# Patient Record
Sex: Female | Born: 1985 | Race: White | Hispanic: No | Marital: Married | Smoking: Never smoker
Health system: Southern US, Community
[De-identification: ages and names within clinical notes are randomized; demographics above are authoritative.]

## PROBLEM LIST (undated history)

## (undated) DIAGNOSIS — H47011 Ischemic optic neuropathy, right eye: Secondary | ICD-10-CM

## (undated) DIAGNOSIS — B009 Herpesviral infection, unspecified: Secondary | ICD-10-CM

## (undated) HISTORY — PX: NO PAST SURGERIES: SHX2092

## (undated) HISTORY — DX: Herpesviral infection, unspecified: B00.9

## (undated) HISTORY — DX: Ischemic optic neuropathy, right eye: H47.011

---

## 2008-08-17 ENCOUNTER — Inpatient Hospital Stay: Payer: Self-pay | Admitting: Obstetrics and Gynecology

## 2008-08-17 ENCOUNTER — Observation Stay: Payer: Self-pay | Admitting: Obstetrics & Gynecology

## 2008-12-28 ENCOUNTER — Ambulatory Visit: Payer: Self-pay | Admitting: Family Medicine

## 2012-07-20 ENCOUNTER — Inpatient Hospital Stay: Payer: Self-pay | Admitting: Obstetrics and Gynecology

## 2012-07-20 LAB — PIH PROFILE
Anion Gap: 10 (ref 7–16)
BUN: 10 mg/dL (ref 7–18)
EGFR (African American): >60
EGFR (Non-African Amer.): >60
Glucose: 72 mg/dL (ref 65–99)
HCT: 34.7 % — ABNORMAL LOW (ref 35.0–47.0)
MCV: 91 fL (ref 80–100)
Platelet: 169 10*3/uL (ref 150–440)
Potassium: 3.7 mmol/L (ref 3.5–5.1)
RBC: 3.81 10*6/uL (ref 3.80–5.20)
SGOT(AST): 29 U/L (ref 15–37)
Sodium: 138 mmol/L (ref 136–145)
WBC: 10.6 10*3/uL (ref 3.6–11.0)

## 2012-07-20 LAB — DIFFERENTIAL
Eosinophil %: 1.2 %
Lymphocyte #: 2.6 10*3/uL (ref 1.0–3.6)
Monocyte #: 0.9 x10 3/mm (ref 0.2–0.9)
Monocyte %: 8.4 %
Neutrophil #: 7 10*3/uL — ABNORMAL HIGH (ref 1.4–6.5)
Neutrophil %: 65.7 %

## 2012-07-20 LAB — PROTEIN / CREATININE RATIO, URINE
Creatinine, Urine: 34.6 mg/dL (ref 30.0–125.0)
Protein, Random Urine: 82 mg/dL — ABNORMAL HIGH (ref 0–12)
Protein/Creat. Ratio: 2370 mg/gCREAT — ABNORMAL HIGH (ref 0–200)

## 2012-07-22 LAB — HEMATOCRIT: HCT: 29.5 % — ABNORMAL LOW (ref 35.0–47.0)

## 2015-04-01 NOTE — H&P (Signed)
L&D Evaluation:  History:   HPI 29 yo G2P1001 @ 1568w6d gestational age by LMP consistent with 19 week ultrasound who presents after being sent from office today with blood pressures in the 140-160 sbp range.  She deneis HA, visual disturbances, and RUQ pain. She has had an uncomplicated pregnancy and received her [facility].  Blood type O+, RI, VZI, RPR NR, HBsAg neg, HV neg, GBS neg (8/15).    Patient's Medical History No Chronic Illness    Patient's Surgical History none    Medications Pre Natal Vitamins    Allergies NKDA    Social History none    Family History Non-Contributory   ROS:   ROS All systems were reviewed.  HEENT, CNS, GI, GU, Respiratory, CV, Renal and Musculoskeletal systems were found to be normal., unless noted in HPI   Exam:   Vital Signs BP >140/90  at admission BP >140 sbp. most in 130's.    Urine Protein 1+, Urine P/C ratio: 2370    General no apparent distress, she is breathing through contractions    Mental Status clear    Chest clear    Heart normal sinus rhythm    Abdomen gravid, tender with contractions    Estimated Fetal Weight Average for gestational age, 6.5-7#    Back no CVAT    Edema trace    Reflexes 2+  brachioradialus    Clonus negative    Pelvic no external lesions, 3cm => 4cm per RN    Mebranes Intact    FHT normal rate with no decels    FHT Description 130/mod var/+accels/no decels    Ucx irregular, q2-5 min    Skin no lesions    Lymph no lymphadenopathy    Other HELLP labs: normal platelets and blood counts, normal LFTs   Impression:   Impression active labor, reactive NST, mild preeclampsia   Plan:   Plan EFM/NST, fluids    Comments - I had a long discussion with the patient and her husband. She has significant proteinuria and has had elevated and mild-range blood pressures today.  She is at 39 weeks and appears to be laboring.  Plan to keep and monitor blood pressures and labor.  I will augment  her if her labor stops. If her blood pressure goes back into the elevated range, will add magnesium. - T&S, IVF   Electronic Signatures: Conard NovakJackson, Eureka Valdes D (MD)  (Signed 29-Aug-13 20:48)  Authored: L&D Evaluation   Last Updated: 29-Aug-13 20:48 by Conard NovakJackson, Guillermo Difrancesco D (MD)

## 2015-12-17 LAB — HM PAP SMEAR: HM Pap smear: NEGATIVE

## 2017-01-24 ENCOUNTER — Ambulatory Visit (INDEPENDENT_AMBULATORY_CARE_PROVIDER_SITE_OTHER): Payer: BC Managed Care – PPO | Admitting: Obstetrics and Gynecology

## 2017-01-24 ENCOUNTER — Encounter: Payer: Self-pay | Admitting: Obstetrics and Gynecology

## 2017-01-24 VITALS — BP 118/70 | Ht 66.0 in | Wt 187.0 lb

## 2017-01-24 DIAGNOSIS — Z01419 Encounter for gynecological examination (general) (routine) without abnormal findings: Secondary | ICD-10-CM

## 2017-01-24 DIAGNOSIS — Z3041 Encounter for surveillance of contraceptive pills: Secondary | ICD-10-CM

## 2017-01-24 MED ORDER — NORGESTIMATE-ETH ESTRADIOL 0.25-35 MG-MCG PO TABS: 1.0000 | ORAL_TABLET | Freq: Every day | ORAL | 11 refills | Status: DC

## 2017-01-24 NOTE — Progress Notes (Signed)
Gynecology Annual Exam  PCP: No PCP Per Patient  Chief Complaint  Patient presents with  . Annual Exam    History of Present Illness: Patient is a 31 y.o. G2P2002 presents for annual exam. The patient has no complaints today. She has no breast, bowel, bladder, or sexual dysfunction complaints.  She continues to menstruate regularly without complaints.    Current contraception is: combined OCP  She is sexually active with her husband with no issues.  Last pap   One year ago, NILM, HPV negative.    The patient is sexually active. The patient wears seatbelts: yes.   last pap: was normal  The patient has regular exercise: yes.  The patient has ever been transfused or tattooed?: yes.  The patient reports that domestic violence in her life is absent.     Review of Systems: Review of Systems  Constitutional: Negative.   HENT: Negative.   Eyes: Negative.   Respiratory: Negative.   Cardiovascular: Negative.   Gastrointestinal: Negative.   Genitourinary: Negative.   Musculoskeletal: Negative.   Skin: Negative.   Neurological: Negative.   Psychiatric/Behavioral: Negative.     Past Medical History:  Diagnosis Date  . Herpes simplex    Past Surgical History:  Procedure Laterality Date  . NO PAST SURGERIES      Medications:   Medication Sig Start Date End Date Taking? Authorizing Provider  norgestimate-ethinyl estradiol (ORTHO-CYCLEN,SPRINTEC,PREVIFEM) 0.25-35 MG-MCG tablet Take 1 tablet by mouth daily.   Yes Historical Provider, MD  valACYclovir (VALTREX) 1000 MG tablet Take 1,000 mg by mouth 2 (two) times daily.   Yes Historical Provider, MD    Allergies: No Known Allergies  Gynecologic History: Patient's last menstrual period was 01/19/2017.  Obstetric History: [id number]  Social History   Social History  . Marital status: Married    Spouse name: N/A  . Number of children: N/A  . Years of education: N/A   Occupational History  . Not on file.   Social History Main  Topics  . Smoking status: Never Smoker  . Smokeless tobacco: Never Used  . Alcohol use Yes  . Drug use: No  . Sexual activity: Yes    Birth control/ protection: Pill   Other Topics Concern  . Not on file   Social History Narrative  . No narrative on file   Family History  Problem Relation Age of Onset  . Bipolar disorder Father   . Hypertension Father   . Colon cancer Paternal Grandfather      Physical Exam BP 118/70   Ht 5\' 6"  (1.676 m)   Wt 187 lb (84.8 kg)   LMP 01/19/2017   BMI 30.18 kg/m   Physical Exam  Constitutional: She is oriented to person, place, and time. She appears well-developed and well-nourished. No distress.  HENT:  Head: Normocephalic and atraumatic.  Eyes: Conjunctivae are normal.  Neck: Normal range of motion. Neck supple. No thyromegaly present.  Cardiovascular: Normal rate, regular rhythm and normal heart sounds.  Exam reveals no gallop and no friction rub.   No murmur heard. Pulmonary/Chest: Effort normal and breath sounds normal. She has no wheezes.  Abdominal: Soft. She exhibits no distension and no mass. There is no tenderness. There is no rebound and no guarding. No hernia. Hernia confirmed negative in the right inguinal area and confirmed negative in the left inguinal area.  Genitourinary: Pelvic exam was performed with patient supine. There is no rash, tenderness or lesion on the right labia. There is no rash,  tenderness or lesion on the left labia. Uterus is not deviated, not enlarged, not fixed and not tender. Cervix exhibits no motion tenderness and no discharge. Right adnexum displays no mass, no tenderness and no fullness. Left adnexum displays no mass and no tenderness. No erythema, tenderness or bleeding in the vagina. No vaginal discharge found.  Musculoskeletal: Normal range of motion.  Lymphadenopathy:       Right: No inguinal adenopathy present.       Left: No inguinal adenopathy present.  Neurological: She is alert and oriented to  person, place, and time.  Skin: Skin is warm and dry. No rash noted.  Psychiatric: She has a normal mood and affect. Her behavior is normal.   Female chaperone present for pelvic and breast  portions of the physical exam  Results:  AUDIT Questionnaire: 5 (low risk behavior) PHQ-9: 0 (negative)  Assessment: 31 y.o. [id number] for routine gyn exam and contraceptive surveillance.   Plan: Problem List Items Addressed This Visit    None    Visit Diagnoses    Women's annual routine gynecological examination    -  Primary   Encounter for surveillance of contraceptive pills       Relevant Medications   norgestimate-ethinyl estradiol (ORTHO-CYCLEN,SPRINTEC,PREVIFEM) 0.25-35 MG-MCG tablet     1) Contraception Education given regarding options for contraception, including oral contraceptives. Will continue current OCP.  2) STI screening was offered and declined  3) Pap - ASCCP guidelines and rational discussed.  Patient opts for routine screening interval. Last was one year ago and was normal.   4) Routine healthcare maintenance including cholesterol, diabetes screening not indicated today.  5) Follow up 1 year for routine annual exam  Thomasene MohairStephen Jaquann Guarisco, MD 01/24/2017 5:29 PM

## 2017-09-05 ENCOUNTER — Other Ambulatory Visit: Payer: Self-pay | Admitting: Ophthalmology

## 2017-09-05 ENCOUNTER — Emergency Department: Payer: BC Managed Care – PPO

## 2017-09-05 ENCOUNTER — Emergency Department
Admission: EM | Admit: 2017-09-05 | Discharge: 2017-09-06 | Disposition: A | Discharge status: Home / Self Care | Payer: BC Managed Care – PPO | Attending: Emergency Medicine | Admitting: Emergency Medicine

## 2017-09-05 ENCOUNTER — Encounter: Payer: Self-pay | Admitting: Emergency Medicine

## 2017-09-05 DIAGNOSIS — H53131 Sudden visual loss, right eye: Secondary | ICD-10-CM | POA: Diagnosis present

## 2017-09-05 DIAGNOSIS — H47011 Ischemic optic neuropathy, right eye: Secondary | ICD-10-CM

## 2017-09-05 DIAGNOSIS — H4710 Unspecified papilledema: Secondary | ICD-10-CM

## 2017-09-05 DIAGNOSIS — Z79899 Other long term (current) drug therapy: Secondary | ICD-10-CM | POA: Diagnosis not present

## 2017-09-05 DIAGNOSIS — H469 Unspecified optic neuritis: Secondary | ICD-10-CM | POA: Diagnosis not present

## 2017-09-05 HISTORY — DX: Ischemic optic neuropathy, right eye: H47.011

## 2017-09-05 MED ORDER — GADOBENATE DIMEGLUMINE 529 MG/ML IV SOLN
15.0000 mL | Freq: Once | INTRAVENOUS | Status: AC | PRN
  Administered 2017-09-05: 15 mL via INTRAVENOUS

## 2017-09-05 NOTE — ED Notes (Signed)
MRI called, states they will be ready for pt in an hour and 15 mins, pt notified of this.

## 2017-09-05 NOTE — Discharge Instructions (Signed)
Dr. Inez Pilgrim will call you in the morning. He has set up an appointment with a neural ophthalmologist for you at [facility] please be sure to keep that. If anything gets worse please return or see Dr. Inez Pilgrim immediately.

## 2017-09-05 NOTE — ED Provider Notes (Signed)
[facility] Emergency Department Provider Note   ____________________________________________   First MD Initiated Contact with Patient 09/05/17 1604     (approximate)  I have reviewed the triage vital signs and the nursing notes.   HISTORY  Chief Complaint vision changes    HPI Victoria Lowe is a 31 y.o. female Comes from [name] with a 2 day history of vision loss in the right eye. Dr. Inez Pilgrim. She has some edema in the optic nerve. Optic neuritis. He wants to get an MRI of her brain and orbits which I have ordered. Patient denies any other problems pain numbness weakness or tingling anywhere.   Past Medical History:  Diagnosis Date  . Herpes simplex     There are no active problems to display for this patient.   Past Surgical History:  Procedure Laterality Date  . NO PAST SURGERIES      Prior to Admission medications   Medication Sig Start Date End Date Taking? Authorizing Provider  norgestimate-ethinyl estradiol (ORTHO-CYCLEN,SPRINTEC,PREVIFEM) 0.25-35 MG-MCG tablet Take 1 tablet by mouth daily. 01/24/17 02/21/17  Conard Novak, MD  norgestimate-ethinyl estradiol (ORTHO-CYCLEN,SPRINTEC,PREVIFEM) 0.25-35 MG-MCG tablet Take 1 tablet by mouth daily. 01/24/17 02/21/17  Conard Novak, MD  valACYclovir (VALTREX) 1000 MG tablet Take 1,000 mg by mouth 2 (two) times daily.    [provider]    Allergies Patient has no known allergies.  Family History  Problem Relation Age of Onset  . Bipolar disorder Father   . Hypertension Father   . Colon cancer Paternal Grandfather     Social History Social History  Substance Use Topics  . Smoking status: Never Smoker  . Smokeless tobacco: Never Used  . Alcohol use Yes    Review of Systems  Constitutional: No fever/chills Eyes: see history of present illness ENT: No sore throat. Cardiovascular: Denies chest pain. Respiratory: Denies shortness of  breath. Gastrointestinal: No abdominal pain.  No nausea, no vomiting.  No diarrhea.  No constipation. Genitourinary: Negative for dysuria. Musculoskeletal: Negative for back pain. Skin: Negative for rash. Neurological: Negative for headaches, focal weakness or numbness.   ____________________________________________   PHYSICAL EXAM:  VITAL SIGNS: ED Triage Vitals  Enc Vitals Group     BP 09/05/17 1608 (!) 149/87     Pulse Rate 09/05/17 1608 (!) 106     Resp --      Temp 09/05/17 1612 98.5 F (36.9 C)     Temp Source 09/05/17 1612 Oral     SpO2 09/05/17 1608 97 %     Weight --      Height --      Head Circumference --      Peak Flow --      Pain Score --      Pain Loc --      Pain Edu? --      Excl. in GC? --     Constitutional: Alert and oriented. Well appearing and in no acute distress. Eyes: Conjunctivae are normal. pupils dilated by ophthalmology EOMI. Head: Atraumatic. Nose: No congestion/rhinnorhea. Mouth/Throat: Mucous membranes are moist.  Oropharynx non-erythematous. Neck: No stridor.   }Cardiovascular: Normal rate, regular rhythm. Grossly normal heart sounds.  Good peripheral circulation. Respiratory: Normal respiratory effort.  No retractions. Lungs CTAB. Gastrointestinal: Soft and nontender.  Musculoskeletal: No lower extremity tenderness nor edema.   Neurologic:  Normal speech and language. No gross focal neurologic deficits are appreciated. specifically cannular 3 through 12 are intact. right eye and  visual fields  were not checked. motor strength is 5 over 5 throughout sensation is intact throughout cerebellar finger to nose and rapid alternating movements and hands are normalNo gait instability. Skin:  Skin is warm, dry and intact. No rash noted. Psychiatric: Mood and affect are normal. Speech and behavior are normal.  ____________________________________________   LABS (all labs ordered are listed, but only abnormal results are displayed)  Labs  Reviewed - No data to display ____________________________________________  EKG   ____________________________________________  RADIOLOGY  MRI shows subtle optic neuritis.there is only one subcentimeter white matter lesion does not look like it is significant per radiology ___________________________________________   PROCEDURES  Procedure(s) performed:  Procedures  Critical Care performed:   ____________________________________________   INITIAL IMPRESSION / ASSESSMENT AND PLAN / ED COURSE  old records were reviewed. Patient has no medical problems. Patient sent here by Dr. Inez Pilgrim I discussed with Dr. Inez Pilgrim.    discussed results again with Dr. Inez Pilgrim. This does not look like MS to radiology Dr. Inez Pilgrim or myself. Dr. Inez Pilgrim is set up a follow-up appointment with neuro-ophthalmologist [facility] he will call the patient in the morning.   ____________________________________________   FINAL CLINICAL IMPRESSION(S) / ED DIAGNOSES  Final diagnoses:  Optic neuritis      NEW MEDICATIONS STARTED DURING THIS VISIT:  New Prescriptions   No medications on file     Note:  This document was prepared using Dragon voice recognition software and may include unintentional dictation errors.    Arnaldo Natal, MD 09/05/17 [phone]

## 2017-09-05 NOTE — ED Notes (Signed)
Patient transported to MRI 

## 2017-09-05 NOTE — ED Triage Notes (Signed)
ARrives from Optho with c/o loss of vision to right lower eye since Saturday.

## 2017-09-06 NOTE — ED Notes (Signed)

## 2017-11-08 ENCOUNTER — Encounter: Payer: Self-pay | Admitting: Physician Assistant

## 2017-11-08 ENCOUNTER — Ambulatory Visit (INDEPENDENT_AMBULATORY_CARE_PROVIDER_SITE_OTHER): Payer: BC Managed Care – PPO | Admitting: Physician Assistant

## 2017-11-08 VITALS — BP 132/82 | HR 92 | Temp 98.8°F | Resp 16 | Ht 66.0 in | Wt 195.0 lb

## 2017-11-08 DIAGNOSIS — Z13220 Encounter for screening for lipoid disorders: Secondary | ICD-10-CM

## 2017-11-08 DIAGNOSIS — Z0000 Encounter for general adult medical examination without abnormal findings: Secondary | ICD-10-CM

## 2017-11-08 DIAGNOSIS — Z131 Encounter for screening for diabetes mellitus: Secondary | ICD-10-CM

## 2017-11-08 DIAGNOSIS — Z23 Encounter for immunization: Secondary | ICD-10-CM | POA: Diagnosis not present

## 2017-11-08 DIAGNOSIS — Z8619 Personal history of other infectious and parasitic diseases: Secondary | ICD-10-CM

## 2017-11-08 DIAGNOSIS — Z1329 Encounter for screening for other suspected endocrine disorder: Secondary | ICD-10-CM

## 2017-11-08 DIAGNOSIS — H47011 Ischemic optic neuropathy, right eye: Secondary | ICD-10-CM

## 2017-11-08 MED ORDER — VALACYCLOVIR HCL 1 G PO TABS: ORAL_TABLET | ORAL | 1 refills | Status: DC

## 2017-11-08 NOTE — Patient Instructions (Signed)

## 2017-11-08 NOTE — Progress Notes (Signed)
Patient: Victoria Lowe Female    DOB: Jul 16, 1986   31 y.o.   MRN: [id number] Visit Date: 11/08/2017  Today's Provider: Trey SailorsAdriana M Pollak, PA-C   Chief Complaint  Patient presents with  . Establish Care   Subjective:    HPI   Victoria Lowe is a 31 y/o woman presenting today to establish care. [place] with her husband of 10 years and her two boys, ages 409 and 395. She works as a Neurosurgeondata manager.  She has a history of menorrhagia for which she is on Sprintec, prescribed by gynecology. Her last PAP was 01/2017 [facility], she cannot remember if there was HPV with it. She denies having abnormal PAPs.  She has a history of non-arteritis ischemic optic neuritis. This was diagnosed after an event in October when she woke up with a spot in her right sided vision that increasingly enlarged over the next few days. She saw her eye doctor who then sent her to the ER where she underwent MRI brain and MRI orbits. MRI brain revealed one small isolated lesion not thought to be of significance and MRI orbits revealed findings consistent with optic neuritis. She is being followed by [facility] for surveillance. Per the patient and the notes in Epic, it was recommended that she be evaluated and managed for any sort of vascular risk. She was also instructed to take ASA 81 mg daily, which she has been doing.  She also has a history of oral cold sores, previously took Valtrex for this. Requesting refill today.      No Known Allergies   Current Outpatient Medications:  .  Norgestimate-Ethinyl Estradiol Triphasic 0.18/0.215/0.25 MG-35 MCG tablet, Take by mouth., Disp: , Rfl:  .  valACYclovir (VALTREX) 1000 MG tablet, Take 1,000 mg by mouth 2 (two) times daily., Disp: , Rfl:  .  norgestimate-ethinyl estradiol (ORTHO-CYCLEN,SPRINTEC,PREVIFEM) 0.25-35 MG-MCG tablet, Take 1 tablet by mouth daily., Disp: 1 Package, Rfl: 11 .  norgestimate-ethinyl estradiol  (ORTHO-CYCLEN,SPRINTEC,PREVIFEM) 0.25-35 MG-MCG tablet, Take 1 tablet by mouth daily., Disp: 1 Package, Rfl: 11  Review of Systems  Constitutional: Negative.   HENT: Negative.   Eyes: Negative.   Respiratory: Negative.   Cardiovascular: Negative.   Gastrointestinal: Negative.   Endocrine: Negative.   Genitourinary: Negative.   Musculoskeletal: Negative.   Skin: Negative.   Allergic/Immunologic: Negative.   Neurological: Negative.   Hematological: Negative.   Psychiatric/Behavioral: Negative.     Social History   Tobacco Use  . Smoking status: Never Smoker  . Smokeless tobacco: Never Used  Substance Use Topics  . Alcohol use: Yes    Alcohol/week: 4.2 oz    Types: 2 Glasses of wine, 5 Cans of beer per week   Objective:   BP 132/82 (BP Location: Right Arm, Patient Position: Sitting, Cuff Size: Normal)   Pulse 92   Temp 98.8 F (37.1 C) (Oral)   Resp 16   Ht 5\' 6"  (1.676 m)   Wt 195 lb (88.5 kg)   LMP 10/18/2017   BMI 31.47 kg/m  Vitals:   11/08/17 0904  BP: 132/82  Pulse: 92  Resp: 16  Temp: 98.8 F (37.1 C)  TempSrc: Oral  Weight: 195 lb (88.5 kg)  Height: 5\' 6"  (1.676 m)     Physical Exam  Constitutional: She is oriented to person, place, and time. She appears well-developed and well-nourished.  HENT:  Right Ear: Tympanic membrane and external ear normal.  Left Ear:  Tympanic membrane and external ear normal.  Mouth/Throat: Oropharynx is clear and moist. No oropharyngeal exudate.  Eyes: Conjunctivae are normal.  Neck: Neck supple. No thyromegaly present.  Cardiovascular: Normal rate and regular rhythm.  Pulmonary/Chest: Effort normal and breath sounds normal.  Abdominal: Soft. Bowel sounds are normal.  Lymphadenopathy:    She has no cervical adenopathy.  Neurological: She is alert and oriented to person, place, and time.  Skin: Skin is warm and dry.  Psychiatric: She has a normal mood and affect. Her behavior is normal.        Assessment & Plan:       1. Encounter for medical examination to establish care   - CBC with Differential  2. Diabetes mellitus screening  - Comprehensive Metabolic Panel (CMET)  3. Thyroid disorder screen  - TSH  4. Screening cholesterol level  - Lipid Profile  5. Non-arteritic AION (anterior ischemic optic neuropathy), right  Will screen for diabetes, high cholesterol. She is normotensive in office today. She is taking ASA 81 mg daily.  6. H/O cold sores  - valACYclovir (VALTREX) 1000 MG tablet; 2,000 mg twice a day for one day at first sign of symptoms.  Dispense: 30 tablet; Refill: 1  7. Flu vaccine need  - Flu Vaccine QUAD 6+ mos PF IM (Fluarix Quad PF)  Return in about 1 year (around 11/08/2018) for cpe.  The entirety of the information documented in the History of Present Illness, Review of Systems and Physical Exam were personally obtained by me. Portions of this information were initially documented by Kavin LeechLaura Walsh, CMA and reviewed by me for thoroughness and accuracy.         Trey SailorsAdriana M Pollak, PA-C  [facility]

## 2017-11-11 ENCOUNTER — Telehealth: Payer: Self-pay | Admitting: Physician Assistant

## 2017-11-11 DIAGNOSIS — Z8619 Personal history of other infectious and parasitic diseases: Secondary | ICD-10-CM

## 2017-11-11 NOTE — Telephone Encounter (Signed)
[facility] [address] faxed refill request for the following medications:  valACYclovir (VALTREX) 1000 MG tablet  90 day supply  Last Rx: 11/08/17 Pt has an active Rx but it is a 30 day supply & the request is a 90 day supply LOV: 11/08/17 Please advise. Thanks TNP

## 2017-11-17 MED ORDER — VALACYCLOVIR HCL 1 G PO TABS: ORAL_TABLET | ORAL | 0 refills | Status: DC

## 2017-11-23 ENCOUNTER — Other Ambulatory Visit: Payer: Self-pay | Admitting: Physician Assistant

## 2017-11-24 LAB — CBC WITH DIFFERENTIAL/PLATELET
Basophils Absolute: 0 10*3/uL (ref 0.0–0.2)
Basos: 0 %
EOS (ABSOLUTE): 0.2 10*3/uL (ref 0.0–0.4)
Eos: 1 %
Hematocrit: 37.4 % (ref 34.0–46.6)
Hemoglobin: 12.3 g/dL (ref 11.1–15.9)
Immature Grans (Abs): 0 10*3/uL (ref 0.0–0.1)
Immature Granulocytes: 0 %
Lymphocytes Absolute: 3.1 10*3/uL (ref 0.7–3.1)
Lymphs: 29 %
MCH: 28.9 pg (ref 26.6–33.0)
MCHC: 32.9 g/dL (ref 31.5–35.7)
MCV: 88 fL (ref 79–97)
Monocytes Absolute: 0.8 10*3/uL (ref 0.1–0.9)
Monocytes: 7 %
Neutrophils Absolute: 6.6 10*3/uL (ref 1.4–7.0)
Neutrophils: 63 %
Platelets: 346 10*3/uL (ref 150–379)
RBC: 4.26 x10E6/uL (ref 3.77–5.28)
RDW: 13.1 % (ref 12.3–15.4)
WBC: 10.6 10*3/uL (ref 3.4–10.8)

## 2017-11-24 LAB — LIPID PANEL
Chol/HDL Ratio: 2.8 ratio (ref 0.0–4.4)
Cholesterol, Total: 185 mg/dL (ref 100–199)
HDL: 67 mg/dL (ref >39)
LDL Calculated: 62 mg/dL (ref 0–99)
Triglycerides: 281 mg/dL — ABNORMAL HIGH (ref 0–149)
VLDL Cholesterol Cal: 56 mg/dL — ABNORMAL HIGH (ref 5–40)

## 2017-11-24 LAB — COMPREHENSIVE METABOLIC PANEL
ALT: 20 IU/L (ref 0–32)
AST: 19 IU/L (ref 0–40)
Albumin/Globulin Ratio: 1.6 (ref 1.2–2.2)
Albumin: 4.2 g/dL (ref 3.5–5.5)
Alkaline Phosphatase: 75 IU/L (ref 39–117)
BUN/Creatinine Ratio: 20 (ref 9–23)
BUN: 13 mg/dL (ref 6–20)
Bilirubin Total: 0.5 mg/dL (ref 0.0–1.2)
CO2: 21 mmol/L (ref 20–29)
Calcium: 8.9 mg/dL (ref 8.7–10.2)
Chloride: 101 mmol/L (ref 96–106)
Creatinine, Ser: 0.65 mg/dL (ref 0.57–1.00)
GFR calc Af Amer: 136 mL/min/{1.73_m2} (ref >59)
GFR calc non Af Amer: 118 mL/min/{1.73_m2} (ref >59)
Globulin, Total: 2.7 g/dL (ref 1.5–4.5)
Glucose: 82 mg/dL (ref 65–99)
Potassium: 4.4 mmol/L (ref 3.5–5.2)
Sodium: 137 mmol/L (ref 134–144)
Total Protein: 6.9 g/dL (ref 6.0–8.5)

## 2017-11-24 LAB — TSH: TSH: 5.17 u[IU]/mL — ABNORMAL HIGH (ref 0.450–4.500)

## 2017-11-25 ENCOUNTER — Encounter: Payer: Self-pay | Admitting: Physician Assistant

## 2017-11-25 ENCOUNTER — Telehealth: Payer: Self-pay | Admitting: Emergency Medicine

## 2017-11-25 DIAGNOSIS — R7989 Other specified abnormal findings of blood chemistry: Secondary | ICD-10-CM

## 2017-11-25 NOTE — Telephone Encounter (Signed)
-----   Message from Trey SailorsAdriana M Pollak, [place]-C sent at 11/24/2017 12:14 PM EST ----- CBC no signs of anemia or infection. CMET with no hyperglycemia, normal kidney and liver function. Total cholesterol normal, LDL very low and in good range. Triglycerides slightly high. TSH is also slightly high, could possibly indicate hypothyroidism. Can we call in and add free T3 and T4 for this patient?

## 2017-11-25 NOTE — Telephone Encounter (Signed)
LMTCB. Labs added on.

## 2017-11-30 LAB — T4F+T3FREE
Free T-3: 3.1 pg/mL
Free Thyroxine: 1.23 ng/dL
Thyroxine (T4): 8.7 ug/dL
Triiodothyronine (T-3), Serum: 174 ng/dL — ABNORMAL HIGH

## 2017-11-30 LAB — SPECIMEN STATUS REPORT

## 2017-12-02 ENCOUNTER — Telehealth: Payer: Self-pay

## 2017-12-02 NOTE — Telephone Encounter (Signed)
Pt advised-Kassidie Hendriks V Dragan Tamburrino, RMA  

## 2017-12-02 NOTE — Telephone Encounter (Signed)
-----   Message from Trey SailorsAdriana M Pollak, [place] sent at 12/02/2017  9:08 AM EST ----- TSH was elevated, but T3 and T4 are essentially normal. This would represent subclinical hypothyroidism, it doesn't require treatment with extra thyroid hormone. But it is something to keep an eye on and recheck in one year or sooner if she experiences extreme fatigue, weight gain, or hair loss.

## 2017-12-02 NOTE — Telephone Encounter (Signed)
lmtcb-Melodye Swor V Mushka Laconte, RMA  

## 2018-01-23 ENCOUNTER — Ambulatory Visit (INDEPENDENT_AMBULATORY_CARE_PROVIDER_SITE_OTHER): Payer: BC Managed Care – PPO | Admitting: Physician Assistant

## 2018-01-23 ENCOUNTER — Encounter: Payer: Self-pay | Admitting: Physician Assistant

## 2018-01-23 VITALS — BP 112/76 | HR 105 | Temp 99.1°F | Resp 16 | Wt 192.0 lb

## 2018-01-23 DIAGNOSIS — R05 Cough: Secondary | ICD-10-CM

## 2018-01-23 DIAGNOSIS — R509 Fever, unspecified: Secondary | ICD-10-CM | POA: Diagnosis not present

## 2018-01-23 DIAGNOSIS — R059 Cough, unspecified: Secondary | ICD-10-CM

## 2018-01-23 DIAGNOSIS — J101 Influenza due to other identified influenza virus with other respiratory manifestations: Secondary | ICD-10-CM

## 2018-01-23 LAB — POCT INFLUENZA A/B
Influenza A, POC: POSITIVE — AB
Influenza B, POC: NEGATIVE

## 2018-01-23 NOTE — Progress Notes (Signed)
[facility]  Chief Complaint  Patient presents with  . URI    Started about two days ago.    Subjective:    Patient ID: Victoria Lowe, female    DOB: 06-Apr-1986, 32 y.o.   MRN: [id number]  Upper Respiratory Infection: Victoria Lowe is a 32 y.o. female complaining of symptoms of a URI, possible sinusitis. Symptoms include bilateral ear pain, congestion, cough, fever and sore throat. Onset of symptoms was 2 days ago, unchanged since that time. She also c/o bilateral ear pressure/pain, achiness, congestion, lightheadedness, low grade fever, nasal congestion, sinus pressure and sore throat for the past 2 days .  She is drinking plenty of fluids. Evaluation to date: none. Treatment to date: cough suppressants and decongestants. The treatment has provided no.   Review of Systems  Constitutional: Positive for fatigue and fever. Negative for chills and diaphoresis.  HENT: Positive for congestion, ear pain, postnasal drip, rhinorrhea, sinus pressure, sinus pain, sneezing and sore throat. Negative for ear discharge, nosebleeds, tinnitus, trouble swallowing and voice change.   Eyes: Positive for discharge. Negative for photophobia, pain, redness, itching and visual disturbance.  Respiratory: Positive for cough, chest tightness, shortness of breath and wheezing. Negative for apnea, choking and stridor.   Gastrointestinal: Negative.   Musculoskeletal: Positive for myalgias. Negative for neck pain and neck stiffness.  Neurological: Positive for light-headedness and headaches. Negative for dizziness.       Objective:   BP 112/76 (BP Location: Right Arm, Patient Position: Sitting, Cuff Size: Normal)   Pulse (!) 105   Temp 99.1 F (37.3 C) (Oral)   Resp 16   Wt 192 lb (87.1 kg)   LMP 01/16/2018   SpO2 97%   BMI 30.99 kg/m   Patient Active Problem List   Diagnosis Date Noted  . 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 norgestimate-ethinyl estradiol (ORTHO-CYCLEN,SPRINTEC,PREVIFEM) 0.25-35 MG-MCG tablet Take 1 tablet by mouth daily.  . valACYclovir (VALTREX) 1000 MG tablet 2,000 mg twice a day for one day at first sign of symptoms.   No facility-administered encounter medications on file as of 01/23/2018.     No Known Allergies     Physical Exam  Constitutional: She is oriented to person, place, and time. She appears well-developed and well-nourished.  HENT:  Right Ear: External ear normal.  Left Ear: External ear normal.  Mouth/Throat: Oropharynx is Lowe and moist. No oropharyngeal exudate.  Eyes: Right eye exhibits discharge. Left eye exhibits discharge.  Neck: Neck supple.  Cardiovascular: Normal rate and regular rhythm.  Pulmonary/Chest: Effort normal and breath sounds normal. No respiratory distress. She has no wheezes. She has no rales.  Lymphadenopathy:    She has cervical adenopathy.  Neurological: She is alert and oriented to person, place, and time.  Skin: Skin is warm and dry.  Psychiatric: She has a normal mood and affect. Her behavior is normal.       Assessment & Plan:  1. Influenza A  Work note provided. Patient declines Tamiflu. May message back if not feeling well enough on Thursday and will extend work note.  2. Fever, unspecified fever cause  - POCT Influenza A/B  3. Cough  - POCT Influenza A/B  Patient Instructions  Influenza, Adult Influenza ("the flu") is an infection in the lungs, nose, and throat (respiratory tract). It is caused by a virus.  The flu causes many common cold symptoms, as well as a high fever and body aches. It can make you feel very sick. The flu spreads easily from person to person (is contagious). Getting a flu shot (influenza vaccination) every year is the best way to prevent the flu. Follow these instructions at home:  Take  over-the-counter and prescription medicines only as told by your doctor.  Use a cool mist humidifier to add moisture (humidity) to the air in your home. This can make it easier to breathe.  Rest as needed.  Drink enough fluid to keep your pee (urine) Lowe or pale yellow.  Cover your mouth and nose when you cough or sneeze.  Wash your hands with soap and water often, especially after you cough or sneeze. If you cannot use soap and water, use hand sanitizer.  Stay home from work or school as told by your doctor. Unless you are visiting your doctor, try to avoid leaving home until your fever has been gone for 24 hours without the use of medicine.  Keep all follow-up visits as told by your doctor. This is important. How is this prevented?  Getting a yearly (annual) flu shot is the best way to avoid getting the flu. You may get the flu shot in late summer, fall, or winter. Ask your doctor when you should get your flu shot.  Wash your hands often or use hand sanitizer often.  Avoid contact with people who are sick during cold and flu season.  Eat healthy foods.  Drink plenty of fluids.  Get enough sleep.  Exercise regularly. Contact a doctor if:  You get new symptoms.  You have: ? Chest pain. ? Watery poop (diarrhea). ? A fever.  Your cough gets worse.  You start to have more mucus.  You feel sick to your stomach (nauseous).  You throw up (vomit). Get help right away if:  You start to be short of breath or have trouble breathing.  Your skin or nails turn a bluish color.  You have very bad pain or stiffness in your neck.  You get a sudden headache.  You get sudden pain in your face or ear.  You cannot stop throwing up. This information is not intended to replace advice given to you by your health care provider. Make sure you discuss any questions you have with your health care provider. Document Released: 08/17/2008 Document Revised: 04/15/2016 Document Reviewed:  09/02/2015 Elsevier Interactive Patient Education  2017 [employer]    Return if symptoms worsen or fail to improve.   The entirety of the information documented in the History of Present Illness, Review of Systems and Physical Exam were personally obtained by me. Portions of this information were initially documented by Kavin LeechLaura Walsh, CMA and reviewed by me for thoroughness and accuracy.

## 2018-01-23 NOTE — Patient Instructions (Signed)

## 2018-02-06 ENCOUNTER — Telehealth: Payer: Self-pay | Admitting: Obstetrics and Gynecology

## 2018-02-06 NOTE — Telephone Encounter (Signed)
-----   Message from Mychart, Generic sent at 02/06/2018 10:11 AM EDT -----    Appointment Request From: Victoria Lowe    With Provider: Thomasene MohairStephen Jackson, MD [[facility]]    Preferred Date Range: Any    Preferred Times: Tuesday Afternoon, Wednesday Afternoon, Thursday Afternoon, Friday Afternoon    Reason for visit: Office Visit    Comments:  After 3:00 would be the best.    Called and left voicemail for patient to call back to be schedule

## 2018-02-08 ENCOUNTER — Ambulatory Visit: Payer: BC Managed Care – PPO | Admitting: Obstetrics and Gynecology

## 2018-02-27 ENCOUNTER — Encounter: Payer: Self-pay | Admitting: Obstetrics and Gynecology

## 2018-02-27 ENCOUNTER — Other Ambulatory Visit: Payer: Self-pay

## 2018-02-27 ENCOUNTER — Ambulatory Visit: Payer: BC Managed Care – PPO | Admitting: Obstetrics and Gynecology

## 2018-02-27 DIAGNOSIS — Z3041 Encounter for surveillance of contraceptive pills: Secondary | ICD-10-CM

## 2018-02-27 MED ORDER — NORGESTIMATE-ETH ESTRADIOL 0.25-35 MG-MCG PO TABS: 1.0000 | ORAL_TABLET | Freq: Every day | ORAL | 1 refills | Status: DC

## 2018-03-01 ENCOUNTER — Telehealth: Payer: Self-pay | Admitting: Physician Assistant

## 2018-03-01 ENCOUNTER — Other Ambulatory Visit: Payer: Self-pay | Admitting: Obstetrics and Gynecology

## 2018-03-01 DIAGNOSIS — Z3041 Encounter for surveillance of contraceptive pills: Secondary | ICD-10-CM

## 2018-03-01 NOTE — Telephone Encounter (Signed)
Faxed [facility] on 11/16/2017

## 2018-03-01 NOTE — Telephone Encounter (Signed)
[facility]

## 2018-03-21 ENCOUNTER — Encounter: Payer: Self-pay | Admitting: Obstetrics and Gynecology

## 2018-03-21 ENCOUNTER — Ambulatory Visit (INDEPENDENT_AMBULATORY_CARE_PROVIDER_SITE_OTHER): Payer: BC Managed Care – PPO | Admitting: Obstetrics and Gynecology

## 2018-03-21 VITALS — BP 122/74 | Ht 65.0 in | Wt 200.0 lb

## 2018-03-21 DIAGNOSIS — Z1339 Encounter for screening examination for other mental health and behavioral disorders: Secondary | ICD-10-CM | POA: Diagnosis not present

## 2018-03-21 DIAGNOSIS — Z3041 Encounter for surveillance of contraceptive pills: Secondary | ICD-10-CM | POA: Diagnosis not present

## 2018-03-21 DIAGNOSIS — Z1331 Encounter for screening for depression: Secondary | ICD-10-CM | POA: Diagnosis not present

## 2018-03-21 DIAGNOSIS — Z01419 Encounter for gynecological examination (general) (routine) without abnormal findings: Secondary | ICD-10-CM | POA: Diagnosis not present

## 2018-03-21 MED ORDER — NORGESTIMATE-ETH ESTRADIOL 0.25-35 MG-MCG PO TABS: 1.0000 | ORAL_TABLET | Freq: Every day | ORAL | 4 refills | Status: DC

## 2018-03-21 NOTE — Progress Notes (Signed)
Gynecology Annual Exam   PCP: Trey Sailors, PA-C  Chief Complaint  Patient presents with  . Annual Exam   History of Present Illness:  [name] is a 32 y.o. [id number] who LMP was Patient's last menstrual period was 03/09/2018., presents today for her annual examination.  Her menses are regular every 28-30 days, lasting 4 day(s).  Dysmenorrhea none. She does not have intermenstrual bleeding.  She is single partner, contraception - OCP (estrogen/progesterone).  Last Pap: 12/17/2015  Results were: no abnormalities /neg HPV DNA negative Hx of STDs: HSV  Last mammogram: n/a There is no FH of breast cancer. There is no FH of ovarian cancer. The patient does not do self-breast exams.  Tobacco use: The patient denies current or previous tobacco use. Alcohol use: social drinker Exercise: not active  The patient wears seatbelts: yes.      Past Medical History:  Diagnosis Date  . Herpes simplex   . 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 NO PAST SURGERIES      Prior to Admission medications   Medication Sig Start Date End Date Taking? Authorizing Provider  SPRINTEC 28 0.25-35 MG-MCG tablet TAKE 1 TABLET BY MOUTH DAILY. 03/01/18   Conard Novak, MD  valACYclovir (VALTREX) 1000 MG tablet 2,000 mg twice a day for one day at first sign of symptoms. 11/17/17   Trey Sailors, PA-C   Allergies: No Known Allergies  Gynecologic History: Patient's last menstrual period was 03/09/2018.  Obstetric History: [id number]  Social History   Socioeconomic History  . Marital status: Married    Spouse name: Not on file  . Number of children: Not on file  . Years of education: Not on file  . Highest education level: Not on file  Occupational History  . Not on file  Social Needs  . Financial resource strain: Not on file  . Food insecurity:    Worry: Not on file    Inability:  Not on file  . Transportation needs:    Medical: Not on file    Non-medical: Not on file  Tobacco Use  . Smoking status: Never Smoker  . Smokeless tobacco: Never Used  Substance and Sexual Activity  . Alcohol use: Yes    Alcohol/week: 4.2 oz    Types: 2 Glasses of wine, 5 Cans of beer per week  . Drug use: No  . Sexual activity: Yes    Birth control/protection: Pill  Lifestyle  . Physical activity:    Days per week: Not on file    Minutes per session: Not on file  . Stress: Not on file  Relationships  . Social connections:    Talks on phone: Not on file    Gets together: Not on file    Attends religious service: Not on file    Active member of club or organization: Not on file    Attends meetings of clubs or organizations: Not on file    Relationship status: Not on file  . Intimate partner violence:    Fear of current or ex partner: Not on file    Emotionally abused: Not on file    Physically abused: Not on file    Forced sexual activity: Not on file  Other Topics Concern  . Not on file  Social History Narrative  . Not on file    Family History  Problem Relation Age of Onset  .  Bipolar disorder Father   . Hypertension Father   . Hyperlipidemia Father   . Lung cancer Paternal Grandfather   . COPD Mother   . Hypertension Mother   . Hypertension Maternal Grandmother   . Hyperlipidemia Maternal Grandmother   . Pancreatitis Paternal Grandmother     Review of Systems  Constitutional: Negative.   HENT: Negative.   Eyes: Negative.        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   Cardiovascular: Negative.   Gastrointestinal: Negative.   Genitourinary: Negative.   Musculoskeletal: Negative.   Skin: Negative.   Neurological: Negative.   Psychiatric/Behavioral: Negative.      Physical Exam BP 122/74   Ht  (1.651 m)   Wt 200 lb (90.7 kg)   LMP 03/09/2018   BMI 33.28 kg/m    Physical Exam  Constitutional: She is oriented to  person, place, and time. She appears well-developed and well-nourished. No distress.  Genitourinary: Uterus normal. Pelvic exam was performed with patient supine. There is no rash, tenderness, lesion or injury on the right labia. There is no rash, tenderness, lesion or injury on the left labia. No erythema, tenderness or bleeding in the vagina. No signs of injury around the vagina. No vaginal discharge found. Right adnexum does not display mass, does not display tenderness and does not display fullness. Left adnexum does not display mass, does not display tenderness and does not display fullness. Cervix does not exhibit motion tenderness, lesion, discharge or polyp.   Uterus is mobile and anteverted. Uterus is not enlarged, tender or exhibiting a mass.  HENT:  Head: Normocephalic and atraumatic.  Eyes: EOM are normal. No scleral icterus.  Neck: Normal range of motion. Neck supple. No thyromegaly present.  Cardiovascular: Normal rate and regular rhythm. Exam reveals no gallop and no friction rub.  No murmur heard. Pulmonary/Chest: Effort normal and breath sounds normal. No respiratory distress. She has no wheezes. She has no rales. Right breast exhibits no inverted nipple, no mass, no nipple discharge, no skin change and no tenderness. Left breast exhibits no inverted nipple, no mass, no nipple discharge, no skin change and no tenderness.  Abdominal: Soft. Bowel sounds are normal. She exhibits no distension and no mass. There is no tenderness. There is no rebound and no guarding.  Musculoskeletal: Normal range of motion. She exhibits no edema or tenderness.  Lymphadenopathy:    She has no cervical adenopathy.       Right: No inguinal adenopathy present.       Left: No inguinal adenopathy present.  Neurological: She is alert and oriented to person, place, and time. No cranial nerve deficit.  Skin: Skin is warm and dry. No rash noted. No erythema.  Psychiatric: She has a normal mood and affect. Her  behavior is normal. Judgment normal.    Female chaperone present for pelvic and breast  portions of the physical exam  Results: AUDIT Questionnaire (screen for alcoholism): 7 PHQ-9: 0  Assessment: 32 y.o. [id number] female here for routine annual gynecologic examination.  Plan: Problem List Items Addressed This Visit    None    Visit Diagnoses    Women's annual routine gynecological examination    -  Primary   Screening for depression       Screening for alcoholism       Encounter for surveillance of contraceptive pills       Relevant Medications   norgestimate-ethinyl estradiol (SPRINTEC 28) 0.25-35 MG-MCG tablet  Screening: -- Blood pressure screen normal -- Colonoscopy - not due -- Mammogram - not due -- Weight screening: obese: discussed management options, including lifestyle, dietary, and exercise. -- Depression screening negative (PHQ-9) -- Nutrition: normal -- cholesterol screening: not due for screening -- osteoporosis screening: not due -- tobacco screening: not using -- alcohol screening: AUDIT questionnaire indicates low-risk usage. -- family history of breast cancer screening: done. not at high risk. -- no evidence of domestic violence or intimate partner violence. -- STD screening: gonorrhea/chlamydia NAAT not collected per patient request. -- pap smear not collected per ASCCP guidelines -- HPV vaccination series: not eligilbe  Thomasene Mohair, MD 03/21/2018 4:26 PM

## 2018-05-09 ENCOUNTER — Encounter: Payer: Self-pay | Admitting: Physician Assistant

## 2018-05-09 ENCOUNTER — Ambulatory Visit: Payer: BC Managed Care – PPO | Admitting: Physician Assistant

## 2018-05-09 VITALS — BP 114/68 | HR 84 | Temp 98.7°F | Resp 16 | Wt 199.0 lb

## 2018-05-09 DIAGNOSIS — J029 Acute pharyngitis, unspecified: Secondary | ICD-10-CM | POA: Diagnosis not present

## 2018-05-09 NOTE — Patient Instructions (Signed)

## 2018-05-09 NOTE — Progress Notes (Signed)
Patient: Victoria Lowe Female    DOB: 06-Jul-1986   32 y.o.   MRN: [id number] Visit Date: 05/10/2018  Today's Provider: Trey SailorsAdriana M Pollak, PA-C   Chief Complaint  Patient presents with  . Sore Throat   Subjective:   Exposed to her children with strep throat two weeks ago.   Sore Throat   This is a new problem. The current episode started in the past 7 days. The problem has been gradually worsening. Neither side of throat is experiencing more pain than the other. There has been no fever. Associated symptoms include congestion, coughing and swollen glands. Pertinent negatives include no abdominal pain, diarrhea, drooling, ear discharge, ear pain, headaches, hoarse voice, plugged ear sensation, neck pain, shortness of breath, stridor, trouble swallowing or vomiting. She has tried NSAIDs for the symptoms. The treatment provided mild relief.       No Known Allergies   Current Outpatient Medications:  .  norgestimate-ethinyl estradiol (SPRINTEC 28) 0.25-35 MG-MCG tablet, Take 1 tablet by mouth daily., Disp: 84 tablet, Rfl: 4 .  valACYclovir (VALTREX) 1000 MG tablet, 2,000 mg twice a day for one day at first sign of symptoms., Disp: 90 tablet, Rfl: 0  Review of Systems  Constitutional: Positive for fatigue. Negative for activity change, appetite change, chills, diaphoresis, fever and unexpected weight change.  HENT: Positive for congestion, postnasal drip, sore throat and voice change. Negative for drooling, ear discharge, ear pain, hoarse voice, rhinorrhea, sinus pressure, sinus pain and trouble swallowing.   Respiratory: Positive for cough. Negative for apnea, choking, chest tightness, shortness of breath, wheezing and stridor.   Gastrointestinal: Negative.  Negative for abdominal pain, diarrhea and vomiting.  Musculoskeletal: Negative.  Negative for neck pain.  Neurological: Negative for dizziness, light-headedness and headaches.    Social History   Tobacco Use  .  Smoking status: Never Smoker  . Smokeless tobacco: Never Used  Substance Use Topics  . Alcohol use: Yes    Alcohol/week: 4.2 oz    Types: 2 Glasses of wine, 5 Cans of beer per week   Objective:   BP 114/68 (BP Location: Right Arm, Patient Position: Sitting, Cuff Size: Normal)   Pulse 84   Temp 98.7 F (37.1 C) (Oral)   Resp 16   Wt 199 lb (90.3 kg)   BMI 33.12 kg/m  Vitals:   05/09/18 1606  BP: 114/68  Pulse: 84  Resp: 16  Temp: 98.7 F (37.1 C)  TempSrc: Oral  Weight: 199 lb (90.3 kg)     Physical Exam  Constitutional: She is oriented to person, place, and time. She appears well-developed and well-nourished.  HENT:  Right Ear: Tympanic membrane and ear canal normal.  Left Ear: Tympanic membrane and ear canal normal.  Mouth/Throat: Uvula is midline and mucous membranes are normal. No uvula swelling. Posterior oropharyngeal erythema present. No oropharyngeal exudate or posterior oropharyngeal edema.  Neck: Neck supple.  Cardiovascular: Normal rate and regular rhythm.  Lymphadenopathy:    She has no cervical adenopathy.  Neurological: She is alert and oriented to person, place, and time.  Psychiatric: She has a normal mood and affect. Her behavior is normal.        Assessment & Plan:     1. Sore throat  Rapid strep negative. Call if not improving by Friday, will send in antibiotic.  - POCT rapid strep A  Return if symptoms worsen or fail to improve.  The entirety of the information documented in the History  of Present Illness, Review of Systems and Physical Exam were personally obtained by me. Portions of this information were initially documented by Ashley Royalty, CMA and reviewed by me for thoroughness and accuracy.          Trinna Post, PA-C  [facility]

## 2018-05-10 LAB — POCT RAPID STREP A (OFFICE): Rapid Strep A Screen: NEGATIVE

## 2018-09-27 ENCOUNTER — Encounter: Payer: Self-pay | Admitting: Physician Assistant

## 2019-04-24 ENCOUNTER — Other Ambulatory Visit: Payer: Self-pay

## 2019-04-24 ENCOUNTER — Ambulatory Visit (INDEPENDENT_AMBULATORY_CARE_PROVIDER_SITE_OTHER): Payer: BC Managed Care – PPO | Admitting: Obstetrics and Gynecology

## 2019-04-24 ENCOUNTER — Encounter: Payer: Self-pay | Admitting: Obstetrics and Gynecology

## 2019-04-24 ENCOUNTER — Other Ambulatory Visit (HOSPITAL_COMMUNITY)
Admission: RE | Admit: 2019-04-24 | Discharge: 2019-04-24 | Disposition: A | Discharge status: Home / Self Care | Payer: BC Managed Care – PPO | Source: Ambulatory Visit | Attending: Obstetrics and Gynecology | Admitting: Obstetrics and Gynecology

## 2019-04-24 VITALS — BP 126/88 | Ht 66.0 in | Wt 212.0 lb

## 2019-04-24 DIAGNOSIS — Z1339 Encounter for screening examination for other mental health and behavioral disorders: Secondary | ICD-10-CM

## 2019-04-24 DIAGNOSIS — Z124 Encounter for screening for malignant neoplasm of cervix: Secondary | ICD-10-CM

## 2019-04-24 DIAGNOSIS — Z01419 Encounter for gynecological examination (general) (routine) without abnormal findings: Secondary | ICD-10-CM | POA: Diagnosis not present

## 2019-04-24 DIAGNOSIS — Z1331 Encounter for screening for depression: Secondary | ICD-10-CM

## 2019-04-24 DIAGNOSIS — Z3041 Encounter for surveillance of contraceptive pills: Secondary | ICD-10-CM

## 2019-04-24 LAB — HM PAP SMEAR: HM Pap smear: NORMAL

## 2019-04-24 MED ORDER — NORGESTIMATE-ETH ESTRADIOL 0.25-35 MG-MCG PO TABS: 1.0000 | ORAL_TABLET | Freq: Every day | ORAL | 4 refills | Status: DC

## 2019-04-24 NOTE — Progress Notes (Signed)
Gynecology Annual Exam  PCP: Trey Sailors, PA-C  Chief Complaint  Patient presents with  . Annual Exam   History of Present Illness:  [name] is a 33 y.o. [id number] who LMP was Patient's last menstrual period was 04/12/2019., presents today for her annual examination.  Her menses are regular every 28-30 days, lasting 4 day(s).  Dysmenorrhea none. She does not have intermenstrual bleeding.  She is sexually active, no issues with intercourse.  Last Pap: 12/17/2015 Results were: no abnormalities /neg HPV DNA negative (per report, unable to verify) Hx of STDs: HSV  Last mammogram: There is no FH of breast cancer. There is no FH of ovarian cancer. The patient does not do self-breast exams.  Tobacco use: The patient denies current or previous tobacco use. Alcohol use: social drinker Exercise: not active  The patient wears seatbelts: yes.      Past Medical History:  Diagnosis Date  . Herpes simplex   . 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 NO PAST SURGERIES     Prior to Admission medications   Medication Sig Start Date End Date Taking? Authorizing Provider  norgestimate-ethinyl estradiol (SPRINTEC 28) 0.25-35 MG-MCG tablet Take 1 tablet by mouth daily. 03/21/18  Yes Conard Novak, MD  valACYclovir (VALTREX) 1000 MG tablet 2,000 mg twice a day for one day at first sign of symptoms. 11/17/17  Yes Trey Sailors, PA-C   Allergies: No Known Allergies  Gynecologic History: Patient's last menstrual period was 04/12/2019.  Obstetric History: G2P2002, s/p SVDx2  Social History   Socioeconomic History  . Marital status: Married    Spouse name: Not on file  . Number of children: Not on file  . Years of education: Not on file  . Highest education level: Not on file  Occupational History  . Not on file  Social Needs  . Financial resource strain: Not on file  . Food  insecurity:    Worry: Not on file    Inability: Not on file  . Transportation needs:    Medical: Not on file    Non-medical: Not on file  Tobacco Use  . Smoking status: Never Smoker  . Smokeless tobacco: Never Used  Substance and Sexual Activity  . Alcohol use: Yes    Alcohol/week: 7.0 standard drinks    Types: 2 Glasses of wine, 5 Cans of beer per week  . Drug use: No  . Sexual activity: Yes    Birth control/protection: Pill  Lifestyle  . Physical activity:    Days per week: Not on file    Minutes per session: Not on file  . Stress: Not on file  Relationships  . Social connections:    Talks on phone: Not on file    Gets together: Not on file    Attends religious service: Not on file    Active member of club or organization: Not on file    Attends meetings of clubs or organizations: Not on file    Relationship status: Not on file  . Intimate partner violence:    Fear of current or ex partner: Not on file    Emotionally abused: Not on file    Physically abused: Not on file    Forced sexual activity: Not on file  Other Topics Concern  . Not on file  Social History Narrative  . Not on file    Family History  Problem Relation  Age of Onset  . Bipolar disorder Father   . Hypertension Father   . Hyperlipidemia Father   . Lung cancer Paternal Grandfather   . COPD Mother   . Hypertension Mother   . Hypertension Maternal Grandmother   . Hyperlipidemia Maternal Grandmother   . Pancreatitis Paternal Grandmother     Review of Systems  Constitutional: Negative.   HENT: Negative.   Eyes: Negative.   Respiratory: Negative.   Cardiovascular: Negative.   Gastrointestinal: Negative.   Genitourinary: Negative.   Musculoskeletal: Negative.   Skin: Negative.   Neurological: Negative.   Psychiatric/Behavioral: Negative.      Physical Exam BP 126/88   Ht 5\' 6"  (1.676 m)   Wt 212 lb (96.2 kg)   LMP 04/12/2019   BMI 34.22 kg/m   Physical Exam Constitutional:       General: She is not in acute distress.    Appearance: Normal appearance. She is well-developed.  Genitourinary:     Pelvic exam was performed with patient supine.     Vulva, urethra, bladder and uterus normal.     No inguinal adenopathy present in the right or left side.    No signs of injury in the vagina.     No vaginal discharge, erythema, tenderness or bleeding.     No cervical motion tenderness, discharge, lesion or polyp.     Uterus is mobile.     Uterus is not enlarged or tender.     No uterine mass detected.    Uterus is anteverted.     No right or left adnexal mass present.     Right adnexa not tender or full.     Left adnexa not tender or full.  HENT:     Head: Normocephalic and atraumatic.  Eyes:     General: No scleral icterus.    Conjunctiva/sclera: Conjunctivae normal.  Neck:     Musculoskeletal: Normal range of motion and neck supple.     Thyroid: No thyromegaly.  Cardiovascular:     Rate and Rhythm: Normal rate and regular rhythm.     Heart sounds: No murmur. No friction rub. No gallop.   Pulmonary:     Effort: Pulmonary effort is normal. No respiratory distress.     Breath sounds: Normal breath sounds. No wheezing or rales.  Chest:     Breasts:        Right: No inverted nipple, mass, nipple discharge, skin change or tenderness.        Left: No inverted nipple, mass, nipple discharge, skin change or tenderness.  Abdominal:     General: Bowel sounds are normal. There is no distension.     Palpations: Abdomen is soft. There is no mass.     Tenderness: There is no abdominal tenderness. There is no guarding or rebound.  Musculoskeletal: Normal range of motion.        General: No swelling or tenderness.  Lymphadenopathy:     Cervical: No cervical adenopathy.     Lower Body: No right inguinal adenopathy. No left inguinal adenopathy.  Neurological:     General: No focal deficit present.     Mental Status: She is alert and oriented to person, place, and time.      Cranial Nerves: No cranial nerve deficit.  Skin:    General: Skin is warm and dry.     Findings: No erythema or rash.  Psychiatric:        Mood and Affect: Mood normal.  Behavior: Behavior normal.        Judgment: Judgment normal.    Female chaperone present for pelvic and breast  portions of the physical exam  Results: AUDIT Questionnaire (screen for alcoholism): 6 PHQ-9: 0   Assessment: 33 y.o. E0P2330 female here for routine annual gynecologic examination.  Plan: Problem List Items Addressed This Visit    None    Visit Diagnoses    Women's annual routine gynecological examination    -  Primary   Relevant Medications   norgestimate-ethinyl estradiol (SPRINTEC 28) 0.25-35 MG-MCG tablet   Other Relevant Orders   Cytology - PAP   Screening for depression       Screening for alcoholism       Pap smear for cervical cancer screening       Relevant Orders   Cytology - PAP   Encounter for surveillance of contraceptive pills       Relevant Medications   norgestimate-ethinyl estradiol (SPRINTEC 28) 0.25-35 MG-MCG tablet      Screening: -- Blood pressure screen normal -- Colonoscopy - not due -- Mammogram - not due -- Weight screening: obese: discussed management options, including lifestyle, dietary, and exercise. -- Depression screening negative (PHQ-9) -- Nutrition: normal -- cholesterol screening: not due for screening -- osteoporosis screening: not due -- tobacco screening: not using -- alcohol screening: AUDIT questionnaire indicates low-risk usage. -- family history of breast cancer screening: done. not at high risk. -- no evidence of domestic violence or intimate partner violence. -- STD screening: gonorrhea/chlamydia NAAT not collected per patient request. -- pap smear collected per ASCCP guidelines -- flu vaccine n/a  Thomasene Mohair, MD 04/24/2019 10:24 AM

## 2019-04-26 LAB — CYTOLOGY - PAP
Diagnosis: NEGATIVE
HPV: NOT DETECTED

## 2020-01-19 ENCOUNTER — Ambulatory Visit: Payer: BC Managed Care – PPO

## 2020-01-19 ENCOUNTER — Ambulatory Visit: Payer: BC Managed Care – PPO | Attending: Internal Medicine

## 2020-01-19 DIAGNOSIS — Z23 Encounter for immunization: Secondary | ICD-10-CM | POA: Insufficient documentation

## 2020-01-19 NOTE — Progress Notes (Signed)
   Covid-19 Vaccination Clinic  Name:  Victoria Lowe    MRN: [id number] DOB: 06-Jan-1986  01/19/2020  Victoria Lowe was observed post Covid-19 immunization for 15 minutes without incidence. She was provided with Vaccine Information Sheet and instruction to access the V-Safe system.   Victoria Lowe was instructed to call 911 with any severe reactions post vaccine: Marland Kitchen Difficulty breathing  . Swelling of your face and throat  . A fast heartbeat  . A bad rash all over your body  . Dizziness and weakness    Immunizations Administered    Name Date Dose VIS Date Route   Moderna COVID-19 Vaccine 01/19/2020  2:23 PM 0.5 mL 10/23/2019 Intramuscular   Manufacturer: Moderna   Lot: [id number]   NDC: 51460-479-98

## 2020-02-16 ENCOUNTER — Ambulatory Visit: Payer: BC Managed Care – PPO | Attending: Internal Medicine

## 2020-02-16 DIAGNOSIS — Z23 Encounter for immunization: Secondary | ICD-10-CM

## 2020-02-16 NOTE — Progress Notes (Signed)
   Covid-19 Vaccination Clinic  Name:  Victoria Lowe    MRN: [id number] DOB: 15-Mar-1986  02/16/2020  [name] was observed post Covid-19 immunization for 15 minutes without incident. She was provided with Vaccine Information Sheet and instruction to access the V-Safe system.   [name] was instructed to call 911 with any severe reactions post vaccine: Marland Kitchen Difficulty breathing  . Swelling of face and throat  . A fast heartbeat  . A bad rash all over body  . Dizziness and weakness   Immunizations Administered    Name Date Dose VIS Date Route   Moderna COVID-19 Vaccine 02/16/2020 11:09 AM 0.5 mL 10/23/2019 Intramuscular   Manufacturer: Gala Murdoch   Lot: [id number]   NDC: 23300-762-26

## 2020-05-09 ENCOUNTER — Encounter: Payer: Self-pay | Admitting: Obstetrics and Gynecology

## 2020-05-09 ENCOUNTER — Other Ambulatory Visit: Payer: Self-pay

## 2020-05-09 ENCOUNTER — Ambulatory Visit (INDEPENDENT_AMBULATORY_CARE_PROVIDER_SITE_OTHER): Payer: BC Managed Care – PPO | Admitting: Obstetrics and Gynecology

## 2020-05-09 VITALS — BP 118/74 | Ht 66.0 in | Wt 184.0 lb

## 2020-05-09 DIAGNOSIS — Z8619 Personal history of other infectious and parasitic diseases: Secondary | ICD-10-CM

## 2020-05-09 DIAGNOSIS — Z01419 Encounter for gynecological examination (general) (routine) without abnormal findings: Secondary | ICD-10-CM

## 2020-05-09 DIAGNOSIS — Z3041 Encounter for surveillance of contraceptive pills: Secondary | ICD-10-CM | POA: Diagnosis not present

## 2020-05-09 DIAGNOSIS — Z1331 Encounter for screening for depression: Secondary | ICD-10-CM

## 2020-05-09 DIAGNOSIS — Z1339 Encounter for screening examination for other mental health and behavioral disorders: Secondary | ICD-10-CM

## 2020-05-09 MED ORDER — NORGESTIMATE-ETH ESTRADIOL 0.25-35 MG-MCG PO TABS: 1.0000 | ORAL_TABLET | Freq: Every day | ORAL | 4 refills | Status: DC

## 2020-05-09 MED ORDER — VALACYCLOVIR HCL 1 G PO TABS: ORAL_TABLET | ORAL | 0 refills | Status: DC

## 2020-05-09 NOTE — Progress Notes (Signed)
Gynecology Annual Exam  PCP: Trey Sailors, PA-C  Chief Complaint  Patient presents with  . Annual Exam   History of Present Illness:  [name] is a 34 y.o. [phone] who LMP was No LMP recorded., presents today for her annual examination.  Her menses are regular every 28-30 days, lasting 4 day(s).  Dysmenorrhea none. She does not have intermenstrual bleeding.  She is sexually active.  She has no issues with intercourse.  Last Pap: 04/2019 Results were: no abnormalities /neg HPV DNA negative Hx of STDs: none  There is no FH of breast cancer. There is no FH of ovarian cancer. The patient does do self-breast exams.  Tobacco use: The patient denies current or previous tobacco use. Alcohol use: social drinker Exercise: 3-4 x per week  The patient wears seatbelts: yes.   The patient reports that domestic violence in her life is absent.   Past Medical History:  Diagnosis Date  . Herpes simplex   . 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 NO PAST SURGERIES      Prior to Admission medications   Medication Sig Start Date End Date Taking? Authorizing Provider  norgestimate-ethinyl estradiol (SPRINTEC 28) 0.25-35 MG-MCG tablet Take 1 tablet by mouth daily. 04/24/19  Yes Conard Novak, MD  valACYclovir (VALTREX) 1000 MG tablet 2,000 mg twice a day for one day at first sign of symptoms. 11/17/17  Yes Trey Sailors, PA-C   Allergies: No Known Allergies  Obstetric History: [id number]  Social History   Socioeconomic History  . Marital status: Married    Spouse name: Not on file  . Number of children: Not on file  . Years of education: Not on file  . Highest education level: Not on file  Occupational History  . Not on file  Tobacco Use  . Smoking status: Never Smoker  . Smokeless tobacco: Never Used  Vaping Use  . Vaping Use: Never used  Substance and Sexual Activity   . Alcohol use: Yes    Alcohol/week: 7.0 standard drinks    Types: 2 Glasses of wine, 5 Cans of beer per week  . Drug use: No  . Sexual activity: Yes    Birth control/protection: Pill  Other Topics Concern  . Not on file  Social History Narrative  . Not on file   Social Determinants of Health   Financial Resource Strain:   . Difficulty of Paying Living Expenses:   Food Insecurity:   . Worried About Programme researcher, broadcasting/film/video in the Last Year:   . Barista in the Last Year:   Transportation Needs:   . Freight forwarder (Medical):   Marland Kitchen Lack of Transportation (Non-Medical):   Physical Activity:   . Days of Exercise per Week:   . Minutes of Exercise per Session:   Stress:   . Feeling of Stress :   Social Connections:   . Frequency of Communication with Friends and Family:   . Frequency of Social Gatherings with Friends and Family:   . Attends Religious Services:   . Active Member of Clubs or Organizations:   . Attends Banker Meetings:   Marland Kitchen Marital Status:   Intimate Partner Violence:   . Fear of Current or Ex-Partner:   . Emotionally Abused:   Marland Kitchen Physically Abused:   . Sexually Abused:     Family History  Problem Relation Age of  Onset  . Bipolar disorder Father   . Hypertension Father   . Hyperlipidemia Father   . Lung cancer Paternal Grandfather   . COPD Mother   . Hypertension Mother   . Hypertension Maternal Grandmother   . Hyperlipidemia Maternal Grandmother   . Pancreatitis Paternal Grandmother     Review of Systems  Constitutional: Negative.   HENT: Negative.   Eyes: Negative.   Respiratory: Negative.   Cardiovascular: Negative.   Gastrointestinal: Negative.   Genitourinary: Negative.   Musculoskeletal: Negative.   Skin: Negative.   Neurological: Negative.   Psychiatric/Behavioral: Negative.      Physical Exam BP 118/74   Ht 5\' 6"  (1.676 m)   Wt 184 lb (83.5 kg)   BMI 29.70 kg/m   Physical Exam Constitutional:      General:  She is not in acute distress.    Appearance: Normal appearance. She is well-developed.  Genitourinary:     Pelvic exam was performed with patient in the lithotomy position.     Vulva, urethra, bladder and uterus normal.     No inguinal adenopathy present in the right or left side.    No signs of injury in the vagina.     No vaginal discharge, erythema, tenderness or bleeding.     No cervical motion tenderness, discharge, lesion or polyp.     Uterus is mobile.     Uterus is not enlarged or tender.     No uterine mass detected.    Uterus is anteverted.     No right or left adnexal mass present.     Right adnexa not tender or full.     Left adnexa not tender or full.  HENT:     Head: Normocephalic and atraumatic.  Eyes:     General: No scleral icterus.    Conjunctiva/sclera: Conjunctivae normal.  Neck:     Thyroid: No thyromegaly.  Cardiovascular:     Rate and Rhythm: Normal rate and regular rhythm.     Heart sounds: No murmur heard.  No friction rub. No gallop.   Pulmonary:     Effort: Pulmonary effort is normal. No respiratory distress.     Breath sounds: Normal breath sounds. No wheezing or rales.  Chest:     Breasts:        Right: No inverted nipple, mass, nipple discharge, skin change or tenderness.        Left: No inverted nipple, mass, nipple discharge, skin change or tenderness.  Abdominal:     General: Bowel sounds are normal. There is no distension.     Palpations: Abdomen is soft. There is no mass.     Tenderness: There is no abdominal tenderness. There is no guarding or rebound.  Musculoskeletal:        General: No swelling or tenderness. Normal range of motion.     Cervical back: Normal range of motion and neck supple.  Lymphadenopathy:     Cervical: No cervical adenopathy.     Lower Body: No right inguinal adenopathy. No left inguinal adenopathy.  Neurological:     General: No focal deficit present.     Mental Status: She is alert and oriented to person,  place, and time.     Cranial Nerves: No cranial nerve deficit.  Skin:    General: Skin is warm and dry.     Findings: No erythema or rash.  Psychiatric:        Mood and Affect: Mood normal.  Behavior: Behavior normal.        Judgment: Judgment normal.     Female chaperone present for pelvic and breast  portions of the physical exam  Results: AUDIT Questionnaire (screen for alcoholism): 6 PHQ-9: 1   Assessment: 34 y.o. G83P2002 female here for routine annual gynecologic examination  Plan: Problem List Items Addressed This Visit      Other   H/O cold sores   Relevant Medications   valACYclovir (VALTREX) 1000 MG tablet    Other Visit Diagnoses    Women's annual routine gynecological examination    -  Primary   Relevant Medications   norgestimate-ethinyl estradiol (SPRINTEC 28) 0.25-35 MG-MCG tablet   Screening for depression       Screening for alcoholism       Encounter for surveillance of contraceptive pills       Relevant Medications   norgestimate-ethinyl estradiol (SPRINTEC 28) 0.25-35 MG-MCG tablet      Screening: -- Blood pressure screen normal -- Weight screening: overweight: continue to monitor -- Depression screening negative (PHQ-9) -- Nutrition: normal -- cholesterol screening: not due for screening -- osteoporosis screening: not due -- tobacco screening: not using -- alcohol screening: AUDIT questionnaire indicates low-risk usage. -- family history of breast cancer screening: done. not at high risk. -- no evidence of domestic violence or intimate partner violence. -- STD screening: gonorrhea/chlamydia NAAT not collected per patient request. -- pap smear not collected per ASCCP guidelines  Refill birth control pills.  Thomasene Mohair, MD 05/09/2020 4:27 PM

## 2020-09-17 ENCOUNTER — Emergency Department: Payer: BC Managed Care – PPO

## 2020-09-17 ENCOUNTER — Emergency Department
Admission: EM | Admit: 2020-09-17 | Discharge: 2020-09-17 | Disposition: A | Discharge status: Home / Self Care | Payer: BC Managed Care – PPO | Attending: Emergency Medicine | Admitting: Emergency Medicine

## 2020-09-17 ENCOUNTER — Ambulatory Visit: Payer: Self-pay

## 2020-09-17 ENCOUNTER — Encounter: Payer: Self-pay | Admitting: Emergency Medicine

## 2020-09-17 ENCOUNTER — Other Ambulatory Visit: Payer: Self-pay

## 2020-09-17 DIAGNOSIS — R079 Chest pain, unspecified: Secondary | ICD-10-CM

## 2020-09-17 DIAGNOSIS — Z20822 Contact with and (suspected) exposure to covid-19: Secondary | ICD-10-CM | POA: Insufficient documentation

## 2020-09-17 DIAGNOSIS — R0789 Other chest pain: Secondary | ICD-10-CM | POA: Insufficient documentation

## 2020-09-17 LAB — BASIC METABOLIC PANEL
Anion gap: 9 (ref 5–15)
BUN: 10 mg/dL (ref 6–20)
CO2: 25 mmol/L (ref 22–32)
Calcium: 9.1 mg/dL (ref 8.9–10.3)
Chloride: 103 mmol/L (ref 98–111)
Creatinine, Ser: 0.54 mg/dL (ref 0.44–1.00)
GFR, Estimated: >60 mL/min (ref >60)
Glucose, Bld: 109 mg/dL — ABNORMAL HIGH (ref 70–99)
Potassium: 3.4 mmol/L — ABNORMAL LOW (ref 3.5–5.1)
Sodium: 137 mmol/L (ref 135–145)

## 2020-09-17 LAB — CBC
HCT: 36 % (ref 36.0–46.0)
Hemoglobin: 12.1 g/dL (ref 12.0–15.0)
MCH: 30.1 pg (ref 26.0–34.0)
MCHC: 33.6 g/dL (ref 30.0–36.0)
MCV: 89.6 fL (ref 80.0–100.0)
Platelets: 311 10*3/uL (ref 150–400)
RBC: 4.02 MIL/uL (ref 3.87–5.11)
RDW: 12.1 % (ref 11.5–15.5)
WBC: 8.8 10*3/uL (ref 4.0–10.5)
nRBC: 0 % (ref 0.0–0.2)

## 2020-09-17 LAB — TROPONIN I (HIGH SENSITIVITY)
Troponin I (High Sensitivity): 2 ng/L (ref <18)
Troponin I (High Sensitivity): 2 ng/L (ref <18)

## 2020-09-17 LAB — RESPIRATORY PANEL BY RT PCR (FLU A&B, COVID)
Influenza A by PCR: NEGATIVE
Influenza B by PCR: NEGATIVE
SARS Coronavirus 2 by RT PCR: NEGATIVE

## 2020-09-17 LAB — POC URINE PREG, ED: Preg Test, Ur: NEGATIVE

## 2020-09-17 LAB — FIBRIN DERIVATIVES D-DIMER (ARMC ONLY): Fibrin derivatives D-dimer (ARMC): 446.08 ng/mL (FEU) (ref 0.00–499.00)

## 2020-09-17 MED ORDER — ACETAMINOPHEN 500 MG PO TABS: 1000.0000 mg | ORAL_TABLET | Freq: Once | ORAL | Status: DC

## 2020-09-17 MED ORDER — NAPROXEN 500 MG PO TABS: 500.0000 mg | ORAL_TABLET | Freq: Once | ORAL | Status: DC

## 2020-09-17 NOTE — ED Provider Notes (Signed)
[facility] Emergency Department Provider Note  ____________________________________________   First MD Initiated Contact with Patient 09/17/20 1923     (approximate)  I have reviewed the triage vital signs and the nursing notes.   HISTORY  Chief Complaint Chest Pain   HPI Victoria Lowe is a 34 y.o. female without significant past medical history currently on OCPs birth control who presents for assessment of subsubsternal chest pressure and tightness that began yesterday and while initially seem to improve overnight got worse again today.  No prior similar episodes.  No clear alleviating or aggravating factors.  Patient denies any other acute symptoms including cough, shortness of breath, fevers, chills, headache, earache, sore throat, nausea, vomiting, diarrhea, dysuria, abdominal pain, back pain, rash, or extremity weakness numbness or tingling.  Patient denies EtOH use, illicit drug use, or tobacco abuse.  No history of CAD, PE/DVT or other significant cardiopulmonary history.         Past Medical History:  Diagnosis Date  . Herpes simplex   . 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 NO PAST SURGERIES      Prior to Admission medications   Medication Sig Start Date End Date Taking? Authorizing Provider  norgestimate-ethinyl estradiol (SPRINTEC 28) 0.25-35 MG-MCG tablet Take 1 tablet by mouth daily. 05/09/20   Conard Novak, MD  valACYclovir (VALTREX) 1000 MG tablet 2,000 mg twice a day for one day at first sign of symptoms. 05/09/20   Conard Novak, MD    Allergies Patient has no known allergies.  Family History  Problem Relation Age of Onset  . Bipolar disorder Father   .  Hypertension Father   . Hyperlipidemia Father   . Lung cancer Paternal Grandfather   . COPD Mother   . Hypertension Mother   . Hypertension Maternal Grandmother   . Hyperlipidemia Maternal Grandmother   . Pancreatitis Paternal Grandmother     Social History Social History   Tobacco Use  . Smoking status: Never Smoker  . Smokeless tobacco: Never Used  Vaping Use  . Vaping Use: Never used  Substance Use Topics  . Alcohol use: Yes    Alcohol/week: 7.0 standard drinks    Types: 2 Glasses of wine, 5 Cans of beer per week  . Drug use: No    Review of Systems  Review of Systems  Constitutional: Negative for chills and fever.  HENT: Negative for sore throat.   Eyes: Negative for pain.  Respiratory: Negative for cough and stridor.   Cardiovascular: Positive for chest pain.  Gastrointestinal: Negative for vomiting.  Genitourinary: Negative for dysuria.  Musculoskeletal: Negative for myalgias.  Skin: Negative for rash.  Neurological: Negative for seizures, loss of consciousness and headaches.  Psychiatric/Behavioral: Negative for suicidal ideas.  All other systems reviewed and are negative.     ____________________________________________   PHYSICAL EXAM:  VITAL SIGNS: ED Triage Vitals  Enc Vitals Group     BP 09/17/20 1528 (!) 165/88     Pulse Rate 09/17/20 1528 98     Resp 09/17/20 1528 17     Temp 09/17/20 1528 100 F (37.8 C)     Temp Source 09/17/20 1528 Oral     SpO2 09/17/20 1528 100 %     Weight  09/17/20 1528 178 lb (80.7 kg)     Height 09/17/20 1528 5\' 6"  (1.676 m)     Head Circumference --      Peak Flow --      Pain Score 09/17/20 1532 4     Pain Loc --      Pain Edu? --      Excl. in GC? --    Vitals:   09/17/20 1528  BP: (!) 165/88  Pulse: 98  Resp: 17  Temp: 100 F (37.8 C)  SpO2: 100%   Physical Exam Vitals and nursing note reviewed.  Constitutional:      General: She is not in acute distress.    Appearance: She is well-developed.    HENT:     Head: Normocephalic and atraumatic.     Right Ear: External ear normal.     Left Ear: External ear normal.     Nose: Nose normal.     Mouth/Throat:     Mouth: Mucous membranes are moist.  Eyes:     Conjunctiva/sclera: Conjunctivae normal.  Cardiovascular:     Rate and Rhythm: Normal rate and regular rhythm.     Heart sounds: No murmur heard.   Pulmonary:     Effort: Pulmonary effort is normal. No respiratory distress.     Breath sounds: Normal breath sounds.  Abdominal:     Palpations: Abdomen is soft.     Tenderness: There is no abdominal tenderness.  Musculoskeletal:     Cervical back: Neck supple. No rigidity.  Skin:    General: Skin is warm and dry.     Capillary Refill: Capillary refill takes less than 2 seconds.  Neurological:     Mental Status: She is alert and oriented to person, place, and time.  Psychiatric:        Mood and Affect: Mood normal.      ____________________________________________   LABS (all labs ordered are listed, but only abnormal results are displayed)  Labs Reviewed  BASIC METABOLIC PANEL - Abnormal; Notable for the following components:      Result Value   Potassium 3.4 (*)    Glucose, Bld 109 (*)    All other components within normal limits  RESPIRATORY PANEL BY RT PCR (FLU A&B, COVID)  CBC  FIBRIN DERIVATIVES D-DIMER ([facility] ONLY)  POC URINE PREG, ED  TROPONIN I (HIGH SENSITIVITY)  TROPONIN I (HIGH SENSITIVITY)   ____________________________________________  EKG  Sinus rhythm with a ventricular rate of 94, normal axis, unremarkable intervals, sinus arrhythmia in lead III with no other evidence of acute ischemia no significant underlying arrhythmia. ____________________________________________  RADIOLOGY  ED MD interpretation: No focal consolidations, pneumothorax, edema, effusions, or other acute thoracic process.  Mediastinum is not wide.  Official radiology report(s): DG Chest 2 View  Result Date:  09/17/2020 CLINICAL DATA:  Acute chest pain EXAM: CHEST - 2 VIEW COMPARISON:  None. FINDINGS: The heart size and mediastinal contours are within normal limits. Both lungs are clear. The visualized skeletal structures are unremarkable. IMPRESSION: No active cardiopulmonary disease. Electronically Signed   By: 09/19/2020.  Shick M.D.   On: 09/17/2020 16:26    ____________________________________________   PROCEDURES  Procedure(s) performed (including Critical Care):  Procedures   ____________________________________________   INITIAL IMPRESSION / ASSESSMENT AND PLAN / ED COURSE        Patient presents with above-stated history exam for assessment of some substernal chest pain.  Patient is hypertensive with a BP of 1 6548 with stable vital signs on  room air.  Exam as above.  Primary differential includes but is not limited to pneumonia, ACS, arrhythmia, acute anemia, metabolic arrangements, PE, myocarditis, pericarditis, and pleurisy.  Overall symptoms described are not as consistent with GI pathology I have very low suspicion for dissection at this time given symmetric pulses and description of pain as well as normal mediastinum on chest x-ray.  No historical or exam findings to suggest an acute traumatic injury or chest wall cellulitis.  Low suspicion for ACS given reassuring EKG and initial troponin is nonelevated.  Overall plan to obtain a second troponin..  Chest x-ray shows no evidence of pneumonia, pneumothorax, or other acute intrathoracic process.  No evidence of arrhythmia on EKG. we will send D-dimer to assess for patient's risk for PE.  Covid PCR was also sent given patient's temperature is slightly elevated at 37.8.  Patient seen by Dr. Larinda Buttery at approximately 2000.  Plan is to follow-up repeat troponin and D-dimer.  If D-dimer is elevated plan is to obtain CTA chest.  If D-dimer and troponin are both within normal limits patient may be discharged with plan for close outpatient PCP  follow-up.  ____________________________________________   FINAL CLINICAL IMPRESSION(S) / ED DIAGNOSES  Final diagnoses:  Chest pain, unspecified type    Medications  acetaminophen (TYLENOL) tablet 1,000 mg (has no administration in time range)  naproxen (NAPROSYN) tablet 500 mg (has no administration in time range)     ED Discharge Orders    None       Note:  This document was prepared using Dragon voice recognition software and may include unintentional dictation errors.   Gilles Chiquito, MD 09/17/20 2015

## 2020-09-17 NOTE — Discharge Instructions (Signed)
Your blood pressure was noted to be elevated today emergency room.  Please have this rechecked by your primary care doctor in 5 to 7 days.

## 2020-09-17 NOTE — ED Notes (Signed)
Called pt with no response. Checked all waiting rooms

## 2020-09-17 NOTE — ED Notes (Signed)
Pt reports at approx 1900 last night she began to have left sided "poking" chest pain, with numb/achy radiation to the left arm. Reports some mild dizziness intially, which cleared up. Pt denies n/v/d/abd pain

## 2020-09-17 NOTE — Telephone Encounter (Signed)
  Patient called stating that she started to have chest tightness and stabbing pain last evening.  She states that it has gotten worse today.  She states the tightness goes across her chest but the pain at 5 is only on the left side. It travels into her left shoulder and her left arm is aching. She has no Hx of heart issues. Denies injury. No nausea sweats cough Per protocol patient will go to ER for evaluation.  She states she has friend to drive and refused 762. Care advice read to patient. She verbalized understanding. Reason for Disposition . Pain also in shoulder(s) or arm(s) or jaw (Exception: pain is clearly made worse by movement)  Answer Assessment - Initial Assessment Questions 1. LOCATION: "Where does it hurt?"       Tight across chest left side stabbing 2. RADIATION: "Does the pain go anywhere else?" (e.g., into neck, jaw, arms, back)    Shoulder shoulder and left arm ache 3. ONSET: "When did the chest pain begin?" (Minutes, hours or days)      Last night 4. PATTERN "Does the pain come and go, or has it been constant since it started?"  "Does it get worse with exertion?"     Last hout worse 5. DURATION: "How long does it last" (e.g., seconds, minutes, hours)     constant 6. SEVERITY: "How bad is the pain?"  (e.g., Scale 1-10; mild, moderate, or severe)    - MILD (1-3): doesn't interfere with normal activities     - MODERATE (4-7): interferes with normal activities or awakens from sleep    - SEVERE (8-10): excruciating pain, unable to do any normal activities       5 7. CARDIAC RISK FACTORS: "Do you have any history of heart problems or risk factors for heart disease?" (e.g., angina, prior heart attack; diabetes, high blood pressure, high cholesterol, smoker, or strong family history of heart disease)    no 8. PULMONARY RISK FACTORS: "Do you have any history of lung disease?"  (e.g., blood clots in lung, asthma, emphysema, birth control pills)    no 9. CAUSE: "What do you think is  causing the chest pain?"    no 10. OTHER SYMPTOMS: "Do you have any other symptoms?" (e.g., dizziness, nausea, vomiting, sweating, fever, difficulty breathing, cough)       no 11. PREGNANCY: "Is there any chance you are pregnant?" "When was your last menstrual period?"      No corrent  Protocols used: CHEST PAIN-A-AH

## 2020-09-17 NOTE — ED Provider Notes (Signed)
-----------------------------------------   8:04 PM on 09/17/2020 -----------------------------------------  Blood pressure (!) 165/88, pulse 98, temperature 100 F (37.8 C), temperature source Oral, resp. rate 17, height 5\' 6"  (1.676 m), weight 80.7 kg, last menstrual period 09/17/2020, SpO2 100 %.  Assuming care from Dr. 09/19/2020.  In short, Victoria Lowe is a 34 y.o. female with a chief complaint of Chest Pain .  Refer to the original H&P for additional details.  The current plan of care is to follow-up repeat troponin and d-dimer for chest pain, if negative patient may be discharged home.  ----------------------------------------- 9:31 PM on 09/17/2020 -----------------------------------------  Repeat troponin within normal limits and D-dimer is also negative.  Patient is appropriate for discharge home with PCP follow-up per Dr. 09/19/2020.  She was counseled to return to the ED for new or worsening symptoms.  Patient agrees with plan.    Katrinka Blazing, MD 09/17/20 2131

## 2020-09-17 NOTE — ED Triage Notes (Signed)
Pt comes into the ED via POV c/o chest pain that started last night.  Pt denies any SHOB, N/V, but did have some mild dizziness with the chest pain last night.  Chest pain was on the left side with some radiation into the arm with an achiness. Pt denies any asthma, COPD, or cardiac history.  Pt in NAD at this time with even and unlabored respirations.

## 2020-10-07 ENCOUNTER — Encounter: Payer: Self-pay | Admitting: Physician Assistant

## 2020-10-07 ENCOUNTER — Other Ambulatory Visit: Payer: Self-pay

## 2020-10-07 ENCOUNTER — Ambulatory Visit: Payer: BC Managed Care – PPO | Admitting: Physician Assistant

## 2020-10-07 VITALS — BP 141/78 | HR 83 | Temp 98.5°F | Ht 66.0 in | Wt 185.3 lb

## 2020-10-07 DIAGNOSIS — Z0000 Encounter for general adult medical examination without abnormal findings: Secondary | ICD-10-CM

## 2020-10-07 DIAGNOSIS — R7989 Other specified abnormal findings of blood chemistry: Secondary | ICD-10-CM

## 2020-10-07 NOTE — Progress Notes (Signed)
Complete physical exam   Patient: Victoria Lowe   DOB: [birth year]   34 y.o. Female  MRN: [id number] Visit Date: 10/07/2020  Today's healthcare provider: Trey Sailors, PA-C   Chief Complaint  Patient presents with  . Annual Exam  I,Victoria Lowe M Victoria Lowe,acting as a scribe for Victoria Sailors, PA-C.,have documented all relevant documentation on the behalf of Victoria Sailors, PA-C,as directed by  Victoria Sailors, PA-C while in the presence of Victoria Sailors, PA-C.  Subjective    Victoria Lowe is a 34 y.o. female who presents today for a complete physical exam.  She reports consuming a general diet. Gym/ health club routine includes bootcamp. She generally feels well. She reports sleeping fairly well. She does have additional problems to discuss today.  HPI   Victoria Lowe is 7 - got tetanus with that pregnancy.   2019 TSH: Slightly elevated. Will recheck today.    Past Medical History:  Diagnosis Date  . Herpes simplex   . 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 NO PAST SURGERIES     Social History   Socioeconomic History  . Marital status: Married    Spouse name: Not on file  . Number of children: Not on file  . Years of education: Not on file  . Highest education level: Not on file  Occupational History  . Not on file  Tobacco Use  . Smoking status: Never Smoker  . Smokeless tobacco: Never Used  Vaping Use  . Vaping Use: Never used  Substance and Sexual Activity  . Alcohol use: Yes    Alcohol/week: 7.0 standard drinks    Types: 2 Glasses of wine, 5 Cans of beer per week  . Drug use: No  . Sexual activity: Yes    Birth control/protection: Pill  Other Topics Concern  . Not on file  Social History Narrative  . Not on file   Social Determinants of Health   Financial Resource Strain:   . Difficulty of Paying Living Expenses: Not on file  Food Insecurity:    . Worried About Programme researcher, broadcasting/film/video in the Last Year: Not on file  . Ran Out of Food in the Last Year: Not on file  Transportation Needs:   . Lack of Transportation (Medical): Not on file  . Lack of Transportation (Non-Medical): Not on file  Physical Activity:   . Days of Exercise per Week: Not on file  . Minutes of Exercise per Session: Not on file  Stress:   . Feeling of Stress : Not on file  Social Connections:   . Frequency of Communication with Friends and Family: Not on file  . Frequency of Social Gatherings with Friends and Family: Not on file  . Attends Religious Services: Not on file  . Active Member of Clubs or Organizations: Not on file  . Attends Banker Meetings: Not on file  . Marital Status: Not on file  Intimate Partner Violence:   . Fear of Current or Ex-Partner: Not on file  . Emotionally Abused: Not on file  . Physically Abused: Not on file  . Sexually Abused: Not on file   Family Status  Relation Name Status  . Father  Alive  . PGF  Deceased  . Mother  Alive  . MGM  Alive  . PGM  (Not Specified)   Family History  Problem Relation Age of Onset  .  Bipolar disorder Father   . Hypertension Father   . Hyperlipidemia Father   . Lung cancer Paternal Grandfather   . COPD Mother   . Hypertension Mother   . Hypertension Maternal Grandmother   . Hyperlipidemia Maternal Grandmother   . Pancreatitis Paternal Grandmother    No Known Allergies  Patient Care Team: Victoria Lowe as PCP - General (Physician Assistant)   Medications: Outpatient Medications Prior to Visit  Medication Sig  . norgestimate-ethinyl estradiol (SPRINTEC 28) 0.25-35 MG-MCG tablet Take 1 tablet by mouth daily.  . valACYclovir (VALTREX) 1000 MG tablet 2,000 mg twice a day for one day at first sign of symptoms.   No facility-administered medications prior to visit.    Review of Systems  Constitutional: Negative.   HENT: Negative.   Eyes: Negative.     Respiratory: Negative.   Cardiovascular: Positive for chest pain (sometimes per pt).  Gastrointestinal: Negative.   Endocrine: Negative.   Genitourinary: Negative.   Musculoskeletal: Negative.   Skin: Negative.   Allergic/Immunologic: Negative.   Neurological: Negative.   Hematological: Negative.   Psychiatric/Behavioral: Negative.       Objective    BP (!) 141/78 (BP Location: Right Arm, Patient Position: Sitting, Cuff Size: Large)   Pulse 83   Temp 98.5 F (36.9 C) (Oral)   Ht 5\' 6"  (1.676 m)   Wt 185 lb 4.8 oz (84.1 kg)   LMP 09/17/2020 (Exact Date)   SpO2 100%   BMI 29.91 kg/m    Physical Exam Constitutional:      Appearance: Normal appearance.  HENT:     Right Ear: Tympanic membrane, ear canal and external ear normal.     Left Ear: Tympanic membrane, ear canal and external ear normal.  Cardiovascular:     Rate and Rhythm: Normal rate and regular rhythm.     Pulses: Normal pulses.     Heart sounds: Normal heart sounds.  Pulmonary:     Effort: Pulmonary effort is normal.     Breath sounds: Normal breath sounds.  Abdominal:     General: Abdomen is flat. Bowel sounds are normal.     Palpations: Abdomen is soft.  Skin:    General: Skin is warm and dry.  Neurological:     General: No focal deficit present.     Mental Status: She is alert and oriented to person, place, and time.  Psychiatric:        Mood and Affect: Mood normal.        Behavior: Behavior normal.       Last depression screening scores PHQ 2/9 Scores 10/07/2020 11/08/2017 01/24/2017  PHQ - 2 Score 0 0 0  PHQ- 9 Score 1 0 0   Last fall risk screening Fall Risk  10/07/2020  Falls in the past year? 0  Number falls in past yr: 0  Injury with Fall? 0  Risk for fall due to : No Fall Risks  Follow up Falls evaluation completed   Last Audit-C alcohol use screening Alcohol Use Disorder Test (AUDIT) 10/07/2020  1. How often do you have a drink containing alcohol? 3  2. How many drinks  containing alcohol do you have on a typical day when you are drinking? 2  3. How often do you have six or more drinks on one occasion? 2  AUDIT-C Score 7  4. How often during the last year have you found that you were not able to stop drinking once you had started? 0  5.  How often during the last year have you failed to do what was normally expected from you because of drinking? 0  6. How often during the last year have you needed a first drink in the morning to get yourself going after a heavy drinking session? 0  7. How often during the last year have you had a feeling of guilt of remorse after drinking? 0  8. How often during the last year have you been unable to remember what happened the night before because you had been drinking? 0  9. Have you or someone else been injured as a result of your drinking? 0  10. Has a relative or friend or a doctor or another health worker been concerned about your drinking or suggested you cut down? 0  Alcohol Use Disorder Identification Test Final Score (AUDIT) 7   A score of 3 or more in women, and 4 or more in men indicates increased risk for alcohol abuse, EXCEPT if all of the points are from question 1   Results for orders placed or performed in visit on 10/07/20  TSH+T4F+T3Free  Result Value Ref Range   TSH 1.620 0.450 - 4.500 uIU/mL   T3, Free 3.3 2.0 - 4.4 pg/mL   Free T4 1.07 0.82 - 1.77 ng/dL    Assessment & Plan    Routine Health Maintenance and Physical Exam  Exercise Activities and Dietary recommendations Goals   None     Immunization History  Administered Date(s) Administered  . Influenza,inj,Quad PF,6+ Mos 11/08/2017  . Influenza-Unspecified 09/10/2019  . Moderna SARS-COVID-2 Vaccination 01/19/2020, 02/16/2020    Health Maintenance  Topic Date Due  . Hepatitis C Screening  Never done  . HIV Screening  Never done  . TETANUS/TDAP  Never done  . INFLUENZA VACCINE  02/19/2021 (Originally 06/22/2020)  . PAP SMEAR-Modifier   04/23/2024  . COVID-19 Vaccine  Completed    Discussed health benefits of physical activity, and encouraged her to engage in regular exercise appropriate for her age and condition.  1. Annual physical exam   2. Elevated TSH  - TSH+T4F+T3Free   Return if symptoms worsen or fail to improve.     ITrey Sailors, PA-C, have reviewed all documentation for this visit. The documentation on 10/08/20 for the exam, diagnosis, procedures, and orders are all accurate and complete.  The entirety of the information documented in the History of Present Illness, Review of Systems and Physical Exam were personally obtained by me. Portions of this information were initially documented by [facility] and reviewed by me for thoroughness and accuracy.     Victoria Lowe  [facility] [phone] (phone) [phone] (fax)  [facility]

## 2020-10-08 LAB — TSH+T4F+T3FREE
Free T4: 1.07 ng/dL (ref 0.82–1.77)
T3, Free: 3.3 pg/mL (ref 2.0–4.4)
TSH: 1.62 u[IU]/mL (ref 0.450–4.500)

## 2020-10-14 ENCOUNTER — Other Ambulatory Visit: Payer: Self-pay

## 2020-10-14 DIAGNOSIS — Z8619 Personal history of other infectious and parasitic diseases: Secondary | ICD-10-CM

## 2020-10-14 MED ORDER — VALACYCLOVIR HCL 1 G PO TABS: ORAL_TABLET | ORAL | 0 refills | Status: AC

## 2021-01-30 ENCOUNTER — Other Ambulatory Visit: Payer: Self-pay

## 2021-01-30 ENCOUNTER — Ambulatory Visit (INDEPENDENT_AMBULATORY_CARE_PROVIDER_SITE_OTHER): Payer: BC Managed Care – PPO | Admitting: Physician Assistant

## 2021-01-30 VITALS — BP 135/87 | HR 98 | Temp 98.7°F | Ht 66.0 in | Wt 188.9 lb

## 2021-01-30 DIAGNOSIS — R1084 Generalized abdominal pain: Secondary | ICD-10-CM | POA: Diagnosis not present

## 2021-01-30 DIAGNOSIS — M25561 Pain in right knee: Secondary | ICD-10-CM | POA: Diagnosis not present
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
## 2021-01-31 ENCOUNTER — Other Ambulatory Visit: Payer: Self-pay

## 2021-01-31 ENCOUNTER — Encounter: Payer: Self-pay | Admitting: Emergency Medicine

## 2021-01-31 ENCOUNTER — Emergency Department: Payer: BC Managed Care – PPO

## 2021-01-31 ENCOUNTER — Emergency Department
Admission: EM | Admit: 2021-01-31 | Discharge: 2021-01-31 | Disposition: A | Discharge status: Home / Self Care | Payer: BC Managed Care – PPO | Attending: Emergency Medicine | Admitting: Emergency Medicine

## 2021-01-31 DIAGNOSIS — R0789 Other chest pain: Secondary | ICD-10-CM | POA: Insufficient documentation

## 2021-01-31 DIAGNOSIS — S301XXA Contusion of abdominal wall, initial encounter: Secondary | ICD-10-CM | POA: Diagnosis not present

## 2021-01-31 DIAGNOSIS — S82831A Other fracture of upper and lower end of right fibula, initial encounter for closed fracture: Secondary | ICD-10-CM

## 2021-01-31 DIAGNOSIS — Y92410 Unspecified street and highway as the place of occurrence of the external cause: Secondary | ICD-10-CM | POA: Diagnosis not present

## 2021-01-31 DIAGNOSIS — S82454A Nondisplaced comminuted fracture of shaft of right fibula, initial encounter for closed fracture: Secondary | ICD-10-CM | POA: Diagnosis not present

## 2021-01-31 DIAGNOSIS — N939 Abnormal uterine and vaginal bleeding, unspecified: Secondary | ICD-10-CM | POA: Insufficient documentation

## 2021-01-31 DIAGNOSIS — S82044A Nondisplaced comminuted fracture of right patella, initial encounter for closed fracture: Secondary | ICD-10-CM | POA: Diagnosis not present

## 2021-01-31 DIAGNOSIS — S82001A Unspecified fracture of right patella, initial encounter for closed fracture: Secondary | ICD-10-CM

## 2021-01-31 DIAGNOSIS — S80911A Unspecified superficial injury of right knee, initial encounter: Secondary | ICD-10-CM | POA: Diagnosis present

## 2021-01-31 LAB — COMPREHENSIVE METABOLIC PANEL
ALT: 15 U/L (ref 0–44)
AST: 20 U/L (ref 15–41)
Albumin: 3.7 g/dL (ref 3.5–5.0)
Alkaline Phosphatase: 48 U/L (ref 38–126)
Anion gap: 7 (ref 5–15)
BUN: 10 mg/dL (ref 6–20)
CO2: 22 mmol/L (ref 22–32)
Calcium: 8.9 mg/dL (ref 8.9–10.3)
Chloride: 110 mmol/L (ref 98–111)
Creatinine, Ser: 0.63 mg/dL (ref 0.44–1.00)
GFR, Estimated: >60 mL/min (ref >60)
Glucose, Bld: 104 mg/dL — ABNORMAL HIGH (ref 70–99)
Potassium: 3.7 mmol/L (ref 3.5–5.1)
Sodium: 139 mmol/L (ref 135–145)
Total Bilirubin: 0.6 mg/dL (ref 0.3–1.2)
Total Protein: 7.1 g/dL (ref 6.5–8.1)

## 2021-01-31 LAB — URINALYSIS, COMPLETE (UACMP) WITH MICROSCOPIC
Bacteria, UA: NONE SEEN
Bilirubin Urine: NEGATIVE
Glucose, UA: NEGATIVE mg/dL
Ketones, ur: NEGATIVE mg/dL
Leukocytes,Ua: NEGATIVE
Nitrite: NEGATIVE
Protein, ur: NEGATIVE mg/dL
Specific Gravity, Urine: 1.011 (ref 1.005–1.030)
pH: 6 (ref 5.0–8.0)

## 2021-01-31 LAB — CBC
HCT: 34.1 % — ABNORMAL LOW (ref 36.0–46.0)
Hemoglobin: 11.3 g/dL — ABNORMAL LOW (ref 12.0–15.0)
MCH: 29.8 pg (ref 26.0–34.0)
MCHC: 33.1 g/dL (ref 30.0–36.0)
MCV: 90 fL (ref 80.0–100.0)
Platelets: 316 10*3/uL (ref 150–400)
RBC: 3.79 MIL/uL — ABNORMAL LOW (ref 3.87–5.11)
RDW: 12.6 % (ref 11.5–15.5)
WBC: 10.3 10*3/uL (ref 4.0–10.5)
nRBC: 0 % (ref 0.0–0.2)

## 2021-01-31 LAB — LIPASE, BLOOD: Lipase: 29 U/L (ref 11–51)

## 2021-01-31 LAB — PREGNANCY, URINE: Preg Test, Ur: NEGATIVE

## 2021-01-31 MED ORDER — NAPROXEN 500 MG PO TABS: 500.0000 mg | ORAL_TABLET | Freq: Two times a day (BID) | ORAL | 0 refills | Status: DC

## 2021-01-31 MED ORDER — IOHEXOL 300 MG/ML  SOLN
100.0000 mL | Freq: Once | INTRAMUSCULAR | Status: AC | PRN
  Administered 2021-01-31: 100 mL via INTRAVENOUS
  Filled 2021-01-31: qty 100

## 2021-01-31 MED ORDER — HYDROCODONE-ACETAMINOPHEN 5-325 MG PO TABS
1.0000 | ORAL_TABLET | Freq: Four times a day (QID) | ORAL | 0 refills | Status: DC | PRN
Start: 2021-01-31 — End: 2021-02-10
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
## 2021-02-09 ENCOUNTER — Telehealth: Payer: Self-pay

## 2021-02-09 NOTE — Telephone Encounter (Signed)
Copied from CRM [id number]. Topic: Appointment Scheduling - Scheduling Inquiry for Clinic >> Feb 09, 2021  3:41 PM Randol Kern wrote: Reason for CRM: Pt would like to be seen sooner than what is available, please advise   Best contact: [phone]

## 2021-02-10 ENCOUNTER — Ambulatory Visit
Admission: EM | Admit: 2021-02-10 | Discharge: 2021-02-10 | Disposition: A | Discharge status: Home / Self Care | Payer: BC Managed Care – PPO | Attending: Family Medicine | Admitting: Family Medicine

## 2021-02-10 ENCOUNTER — Other Ambulatory Visit: Payer: Self-pay

## 2021-02-10 DIAGNOSIS — L03119 Cellulitis of unspecified part of limb: Secondary | ICD-10-CM

## 2021-02-10 DIAGNOSIS — L02419 Cutaneous abscess of limb, unspecified: Secondary | ICD-10-CM | POA: Diagnosis not present

## 2021-02-10 MED ORDER — CEPHALEXIN 500 MG PO CAPS: 500.0000 mg | ORAL_CAPSULE | Freq: Four times a day (QID) | ORAL | 0 refills | Status: DC

## 2021-02-10 NOTE — ED Triage Notes (Signed)
Pt c/o swelling, soreness to right lateral calf area since last Thursday. Pt states she noticed a pea-sized "pimple-looking" area last week, which burst, and drained purulent matter with bloody tinge. Area then became larger in size and redness.   Approx 7.5 cm diameter of erythema with central area of dark red noted, raised in elevation and warmth. Area is at border of knee immobilizer that pt has been wearing since 03/12.  Denies fever, n/v/d. Last took 800mg  ibuprofen yesterday afternoon.

## 2021-02-10 NOTE — Discharge Instructions (Addendum)
Take the antibiotics as prescribed Warm compresses  Follow up as needed for continued or worsening symptoms

## 2021-02-10 NOTE — Telephone Encounter (Signed)
Pt went to urgent care this morning so she wanted to cancel the appointment.

## 2021-02-10 NOTE — ED Provider Notes (Signed)
Victoria Lowe    CSN: [id number] Arrival date & time: 02/10/21  0801      History   Chief Complaint Chief Complaint  Patient presents with  . Other  . Abscess    HPI Victoria Lowe is a 35 y.o. female.   Pt is a 35 year old female that presents with wound, possible abscess to the RLE. This started last Thursday. Started as a small pimple and now redness and swelling around the site. Mild pain. Small amount of drainage. No fever, chills.    Abscess   Past Medical History:  Diagnosis Date  . Herpes simplex   . 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 NO PAST SURGERIES      OB History    Gravida  2   Para  2   Term  2   Preterm  0   AB  0   Living  2     SAB  0   IAB  0   Ectopic  0   Multiple  0   Live Births  2            Home Medications    Prior to Admission medications   Medication Sig Start Date End Date Taking? Authorizing Provider  cephALEXin (KEFLEX) 500 MG capsule Take 1 capsule (500 mg total) by mouth 4 (four) times daily. 02/10/21  Yes Marygrace Sandoval A, NP  norgestimate-ethinyl estradiol (SPRINTEC 28) 0.25-35 MG-MCG tablet Take 1 tablet by mouth daily. 05/09/20  Yes Conard Novak, MD  valACYclovir (VALTREX) 1000 MG tablet 2,000 mg twice a day for one day at first sign of symptoms. 10/14/20   Conard Novak, MD    Family History Family History  Problem Relation Age of Onset  . Bipolar disorder Father   . Hypertension Father   . Hyperlipidemia Father   . Lung cancer Paternal Grandfather   . COPD Mother   . Hypertension Mother   . Hypertension Maternal Grandmother   . Hyperlipidemia Maternal Grandmother   . Pancreatitis Paternal Grandmother     Social  History Social History   Tobacco Use  . Smoking status: Never Smoker  . Smokeless tobacco: Never Used  Vaping Use  . Vaping Use: Never used  Substance Use Topics  . Alcohol use: Yes    Alcohol/week: 7.0 standard drinks    Types: 2 Glasses of wine, 5 Cans of beer per week  . Drug use: No     Allergies   Patient has no known allergies.   Review of Systems Review of Systems   Physical Exam Triage Vital Signs ED Triage Vitals  Enc Vitals Group     BP 02/10/21 0817 (!) 136/99     Pulse Rate 02/10/21 0817 96     Resp 02/10/21 0817 18     Temp 02/10/21 0817 99.2 F (37.3 C)     Temp Source 02/10/21 0817 Oral     SpO2 02/10/21 0817 97 %     Weight --      Height --      Head Circumference --      Peak Flow --      Pain Score 02/10/21 0815 6     Pain Loc --  Pain Edu? --      Excl. in GC? --    No data found.  Updated Vital Signs BP (!) 136/99 (BP Location: Left Arm)   Pulse 96   Temp 99.2 F (37.3 C) (Oral)   Resp 18   LMP 01/31/2021 (Exact Date) Comment: neg preg  SpO2 97%   Visual Acuity Right Eye Distance:   Left Eye Distance:   Bilateral Distance:    Right Eye Near:   Left Eye Near:    Bilateral Near:     Physical Exam Vitals and nursing note reviewed.  Constitutional:      General: She is not in acute distress.    Appearance: Normal appearance. She is not ill-appearing, toxic-appearing or diaphoretic.  HENT:     Head: Normocephalic.  Eyes:     Conjunctiva/sclera: Conjunctivae normal.  Pulmonary:     Effort: Pulmonary effort is normal.  Abdominal:     Palpations: Abdomen is soft.  Musculoskeletal:        General: Normal range of motion.     Cervical back: Normal range of motion.  Skin:    General: Skin is warm and dry.     Findings: No rash.     Comments: Approx 7.5 cm diameter of erythema with central area of dark red noted, raised in elevation and warmth. Area is at border of knee immobilizer that pt has been wearing since 03/12.   Neurological:     Mental Status: She is alert.  Psychiatric:        Mood and Affect: Mood normal.      UC Treatments / Results  Labs (all labs ordered are listed, but only abnormal results are displayed) Labs Reviewed - No data to display  EKG   Radiology No results found.  Procedures Procedures (including critical care time)  Medications Ordered in UC Medications - No data to display  Initial Impression / Assessment and Plan / UC Course  I have reviewed the triage vital signs and the nursing notes.  Pertinent labs & imaging results that were available during my care of the patient were reviewed by me and considered in my medical decision making (see chart for details).     Cellulitis to RLE  Treating with keflex Warm compresses Follow up as needed for continued or worsening symptoms  Final Clinical Impressions(s) / UC Diagnoses   Final diagnoses:  Cellulitis and abscess of leg     Discharge Instructions     Take the antibiotics as prescribed Warm compresses  Follow up as needed for continued or worsening symptoms     ED Prescriptions    Medication Sig Dispense Auth. Provider   cephALEXin (KEFLEX) 500 MG capsule Take 1 capsule (500 mg total) by mouth 4 (four) times daily. 28 capsule Maizy Davanzo A, NP     PDMP not reviewed this encounter.   Janace Aris, NP 02/10/21 [phone]

## 2021-02-17 ENCOUNTER — Ambulatory Visit: Payer: BC Managed Care – PPO | Admitting: Adult Health

## 2021-06-08 ENCOUNTER — Other Ambulatory Visit: Payer: Self-pay

## 2021-06-08 ENCOUNTER — Ambulatory Visit (INDEPENDENT_AMBULATORY_CARE_PROVIDER_SITE_OTHER): Payer: BC Managed Care – PPO | Admitting: Obstetrics and Gynecology

## 2021-06-08 ENCOUNTER — Encounter: Payer: Self-pay | Admitting: Obstetrics and Gynecology

## 2021-06-08 VITALS — BP 142/80 | HR 86 | Ht 66.0 in | Wt 200.0 lb

## 2021-06-08 DIAGNOSIS — Z3041 Encounter for surveillance of contraceptive pills: Secondary | ICD-10-CM

## 2021-06-08 DIAGNOSIS — Z1331 Encounter for screening for depression: Secondary | ICD-10-CM

## 2021-06-08 DIAGNOSIS — Z1339 Encounter for screening examination for other mental health and behavioral disorders: Secondary | ICD-10-CM

## 2021-06-08 DIAGNOSIS — Z01419 Encounter for gynecological examination (general) (routine) without abnormal findings: Secondary | ICD-10-CM

## 2021-06-08 MED ORDER — NORGESTIMATE-ETH ESTRADIOL 0.25-35 MG-MCG PO TABS: 1.0000 | ORAL_TABLET | Freq: Every day | ORAL | 4 refills | Status: AC

## 2021-06-08 NOTE — Progress Notes (Signed)
Gynecology Annual Exam  PCP: Trey Sailors, PA-C  Chief Complaint:  Chief Complaint  Patient presents with   Annual Exam    History of Present Illness:  [name] is a 35 y.o. [id number] who LMP was Patient's last menstrual period was 05/29/2021., presents today for her annual examination.  Her menses are regular every 28-30 days, lasting 5 day(s).  Dysmenorrhea none. She does have a little bleeding the week before.   She is sexually active. No pain with intercourse. She is using the pill for contraception.  Last Pap: 04/2019  Results were: no abnormalities /neg HPV DNA negative Hx of STDs: HSV  There is no FH of breast cancer. There is no FH of ovarian cancer. The patient does do self-breast exams.  Tobacco use: The patient denies current or previous tobacco use. Alcohol use: social drinker Exercise: not lately  The patient wears seatbelts: yes.   The patient reports that domestic violence in her life is absent.   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 Authorizing Provider  norgestimate-ethinyl estradiol (SPRINTEC 28) 0.25-35 MG-MCG tablet Take 1 tablet by mouth daily. 05/09/20  Yes Conard Novak, MD  valACYclovir (VALTREX) 1000 MG tablet 2,000 mg twice a day for one day at first sign of symptoms. 10/14/20  Yes Conard Novak, MD    No Known Allergies  Obstetric History: [id number]  Social History   Socioeconomic History   Marital status: Married    Spouse name: Not on file   Number of children: Not on file   Years of education: Not on file   Highest education level: Not on file  Occupational History   Not on file  Tobacco Use   Smoking status: Never   Smokeless tobacco: Never  Vaping Use    Vaping Use: Never used  Substance and Sexual Activity   Alcohol use: Yes    Alcohol/week: 7.0 standard drinks    Types: 2 Glasses of wine, 5 Cans of beer per week   Drug use: No   Sexual activity: Yes    Birth control/protection: Pill  Other Topics Concern   Not on file  Social History Narrative   Not on file   Social Determinants of Health   Financial Resource Strain: Not on file  Food Insecurity: Not on file  Transportation Needs: Not on file  Physical Activity: Not on file  Stress: Not on file  Social Connections: Not on file  Intimate Partner Violence: Not on file    Family History  Problem Relation Age of Onset   Bipolar disorder Father    Hypertension Father    Hyperlipidemia Father    Lung cancer Paternal Grandfather    COPD Mother    Hypertension Mother    Hypertension Maternal Grandmother    Hyperlipidemia Maternal Grandmother    Pancreatitis Paternal Grandmother     Review of Systems  Constitutional: Negative.   HENT: Negative.    Eyes: Negative.   Respiratory: Negative.    Cardiovascular: Negative.   Gastrointestinal: Negative.   Genitourinary: Negative.   Musculoskeletal: Negative.   Skin: Negative.   Neurological: Negative.   Psychiatric/Behavioral: Negative.      Physical Exam BP (!) 142/80 (Cuff Size:  Normal)   Pulse 86   Ht 5\' 6"  (1.676 m)   Wt 200 lb (90.7 kg)   LMP 05/29/2021   BMI 32.28 kg/m    Physical Exam Constitutional:      General: She is not in acute distress.    Appearance: Normal appearance. She is well-developed.  Genitourinary:     Vulva and bladder normal.     Right Labia: No rash, tenderness, lesions, skin changes or Bartholin's cyst.    Left Labia: No tenderness, lesions, skin changes, Bartholin's cyst or rash.    No inguinal adenopathy present in the right or left side.    Pelvic Tanner Score: 5/5.    No vaginal discharge, erythema, tenderness or bleeding.      Right Adnexa: not tender, not full and no mass  present.    Left Adnexa: not tender, not full and no mass present.    No cervical motion tenderness, discharge, lesion or polyp.     Uterus is not enlarged or tender.     No uterine mass detected.    Pelvic exam was performed with patient in the lithotomy position.  Breasts:    Right: No inverted nipple, mass, nipple discharge, skin change or tenderness.     Left: No inverted nipple, mass, nipple discharge, skin change or tenderness.  HENT:     Head: Normocephalic and atraumatic.  Eyes:     General: No scleral icterus.    Conjunctiva/sclera: Conjunctivae normal.  Neck:     Thyroid: No thyromegaly.  Cardiovascular:     Rate and Rhythm: Normal rate and regular rhythm.     Heart sounds: No murmur heard.   No friction rub. No gallop.  Pulmonary:     Effort: Pulmonary effort is normal. No respiratory distress.     Breath sounds: Normal breath sounds. No wheezing or rales.  Abdominal:     General: Bowel sounds are normal. There is no distension.     Palpations: Abdomen is soft. There is no mass.     Tenderness: There is no abdominal tenderness. There is no guarding or rebound.     Hernia: There is no hernia in the left inguinal area or right inguinal area.  Musculoskeletal:        General: No swelling or tenderness. Normal range of motion.     Cervical back: Normal range of motion and neck supple.  Lymphadenopathy:     Cervical: No cervical adenopathy.     Lower Body: No right inguinal adenopathy. No left inguinal adenopathy.  Neurological:     General: No focal deficit present.     Mental Status: She is alert and oriented to person, place, and time.     Cranial Nerves: No cranial nerve deficit.  Skin:    General: Skin is warm and dry.     Findings: No erythema or rash.  Psychiatric:        Mood and Affect: Mood normal.        Behavior: Behavior normal.        Judgment: Judgment normal.    Female chaperone present for pelvic and breast  portions of the physical  exam  Results: AUDIT Questionnaire (screen for alcoholism): 6 PHQ-9: 0   Assessment: 35 y.o. G9P2002 female here for routine annual gynecologic examination  Plan: Problem List Items Addressed This Visit   None Visit Diagnoses     Women's annual routine gynecological examination    -  Primary   Relevant Medications   norgestimate-ethinyl  estradiol (SPRINTEC 28) 0.25-35 MG-MCG tablet   Screening for depression       Screening for alcoholism       Encounter for surveillance of contraceptive pills       Relevant Medications   norgestimate-ethinyl estradiol (SPRINTEC 28) 0.25-35 MG-MCG tablet       Screening: -- Blood pressure screen elevated: continued to monitor. -- Weight screening: normal -- Depression screening negative (PHQ-9) -- Nutrition: normal -- cholesterol screening: not due for screening -- osteoporosis screening: not due -- tobacco screening: not using -- alcohol screening: AUDIT questionnaire indicates low-risk usage. -- family history of breast cancer screening: done. not at high risk. -- no evidence of domestic violence or intimate partner violence. -- STD screening: gonorrhea/chlamydia NAAT not collected per patient request. -- pap smear not collected per ASCCP guidelines  Birth control refilled.    Thomasene Mohair, MD 06/08/2021 10:30 AM

## 2021-10-07 ENCOUNTER — Encounter: Payer: Self-pay | Admitting: Physician Assistant

## 2022-08-29 IMAGING — CR DG CHEST 2V
2 series · 2 of 2 positions shown · non-contrast
Comparison: None.

CLINICAL DATA: Acute chest pain

EXAM:
CHEST - 2 VIEW

[chest pa]
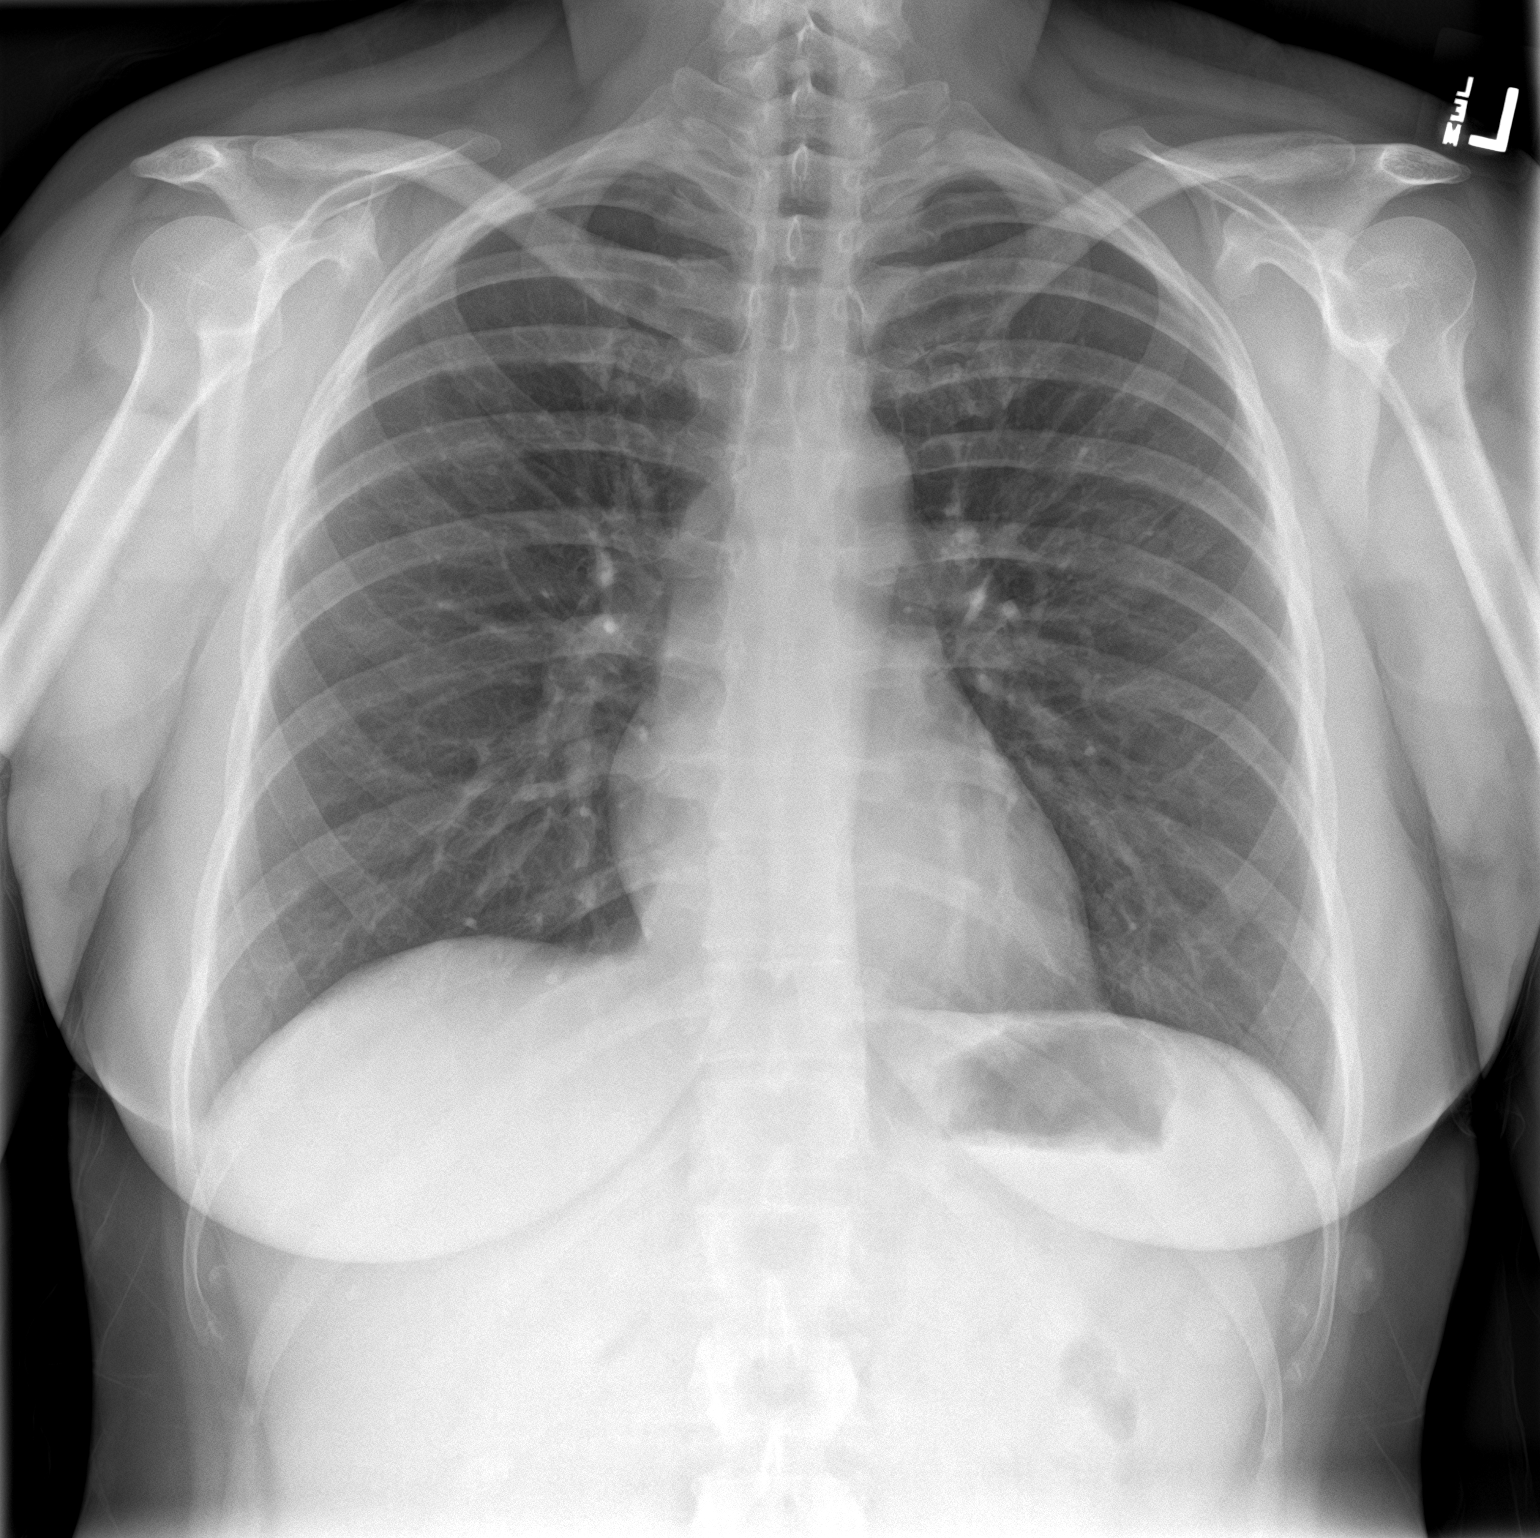

[chest lat]
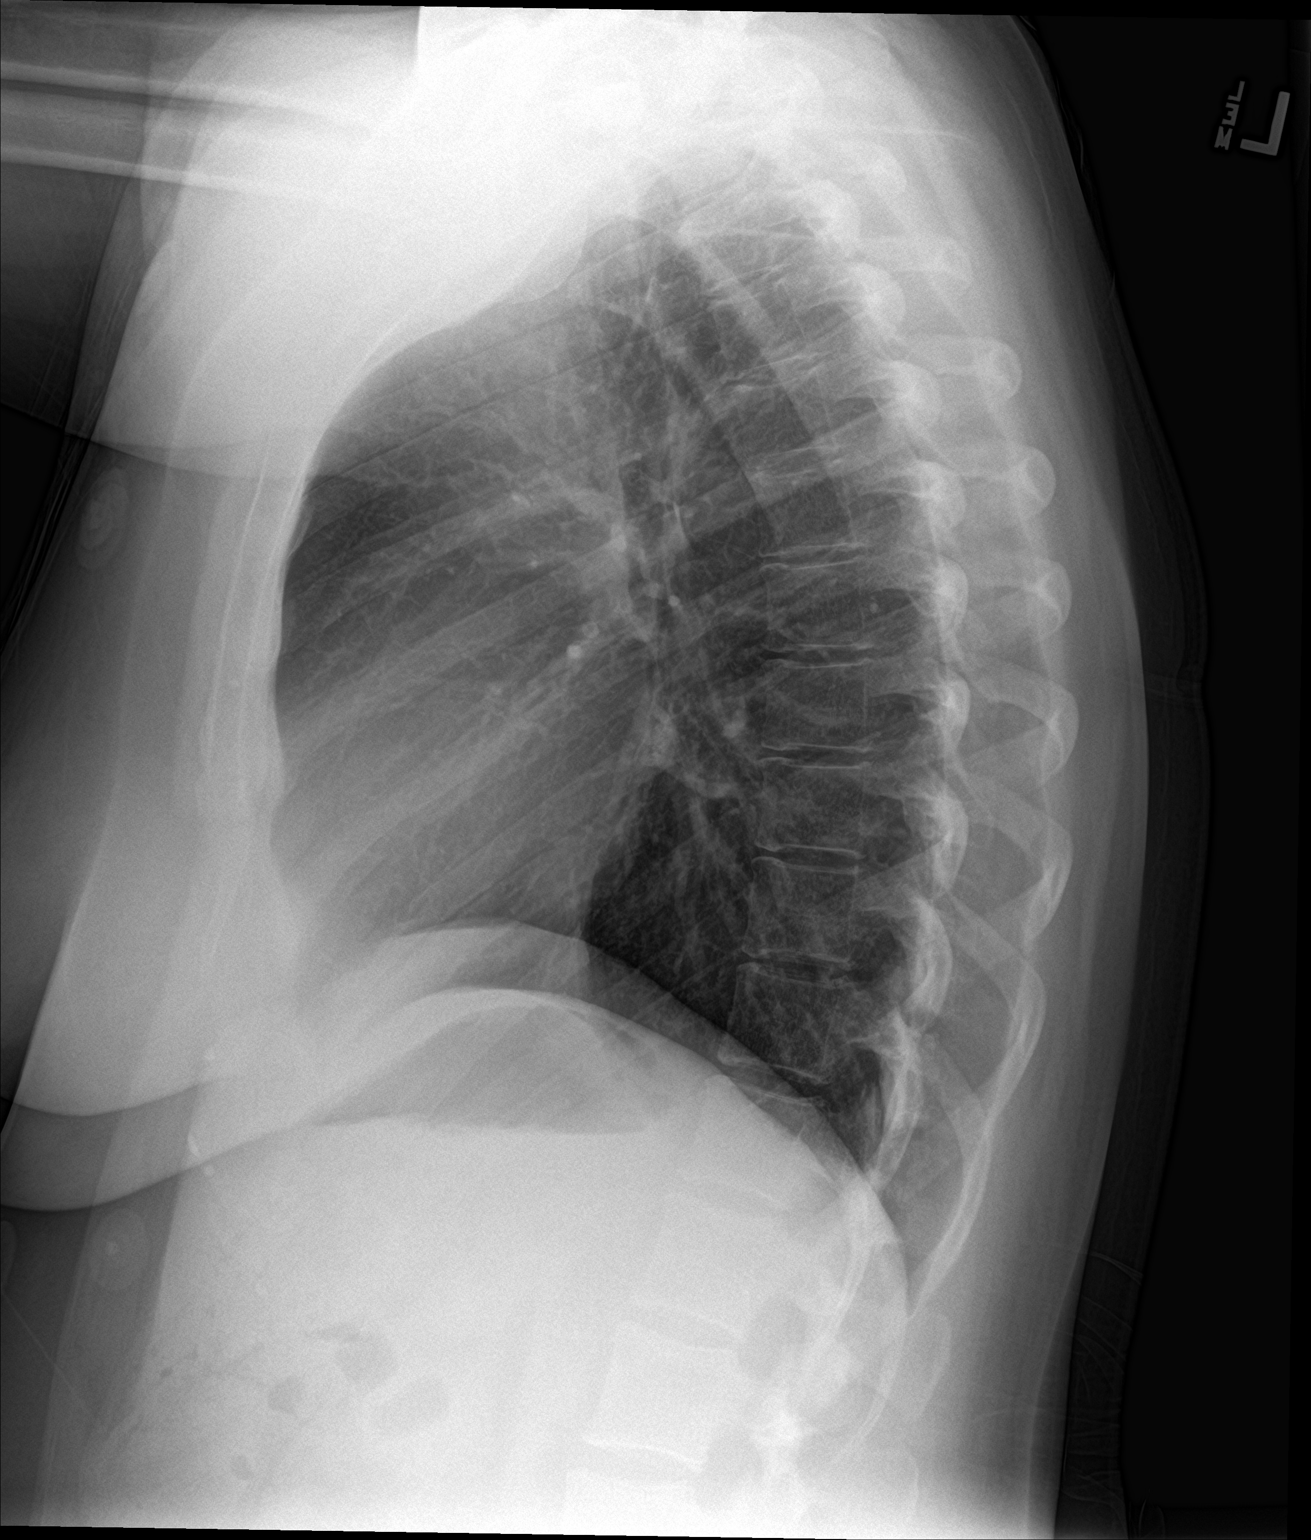

[2 of 2 positions shown; findings below may reference images not displayed]

FINDINGS: The heart size and mediastinal contours are within normal limits.
Both lungs are clear. The visualized skeletal structures are
unremarkable.
IMPRESSION: No active cardiopulmonary disease.

## 2022-12-30 ENCOUNTER — Encounter: Payer: Self-pay | Admitting: Family Medicine

## 2022-12-30 ENCOUNTER — Ambulatory Visit: Payer: BC Managed Care – PPO | Admitting: Family Medicine

## 2022-12-30 VITALS — BP 140/78 | Temp 99.0°F | Ht 66.0 in | Wt 211.0 lb

## 2022-12-30 DIAGNOSIS — R509 Fever, unspecified: Secondary | ICD-10-CM | POA: Insufficient documentation

## 2022-12-30 DIAGNOSIS — U071 COVID-19: Secondary | ICD-10-CM | POA: Diagnosis not present

## 2022-12-30 DIAGNOSIS — R5383 Other fatigue: Secondary | ICD-10-CM

## 2022-12-30 DIAGNOSIS — I10 Essential (primary) hypertension: Secondary | ICD-10-CM | POA: Insufficient documentation

## 2022-12-30 DIAGNOSIS — R5081 Fever presenting with conditions classified elsewhere: Secondary | ICD-10-CM

## 2022-12-30 LAB — POCT INFLUENZA A/B
Influenza A, POC: NEGATIVE
Influenza B, POC: NEGATIVE

## 2022-12-30 LAB — POC COVID19 BINAXNOW: SARS Coronavirus 2 Ag: POSITIVE — AB

## 2022-12-30 NOTE — Assessment & Plan Note (Signed)
Unknown cause, with POC + COVID

## 2022-12-30 NOTE — Progress Notes (Signed)
Acute Office Visit  Introduced to nurse practitioner role and practice setting.  All questions answered.  Discussed provider/patient relationship and expectations.   Subjective:     Patient ID: Victoria Lowe, female    DOB: [birth year], 37 y.o.   MRN: [id number]   HPI Patient is in today for unknown fevers, wishes to have COVID and flu testing with co-existing complaints of HA, sinus congestion and body aches. Works in a school.  Review of Systems  Constitutional:  Positive for diaphoresis, fever and malaise/fatigue.  HENT:  Positive for congestion.   Musculoskeletal:  Positive for myalgias.       Objective:    BP (!) 140/78 (BP Location: Right Arm, Patient Position: Sitting, Cuff Size: Large)   Temp 99 F (37.2 C)   Ht 5\' 6"  (1.676 m)   Wt 211 lb (95.7 kg)   SpO2 98%   BMI 34.06 kg/m   BP Readings from Last 3 Encounters:  12/30/22 (!) 140/78  06/08/21 (!) 142/80  02/10/21 (!) 136/99   Wt Readings from Last 3 Encounters:  12/30/22 211 lb (95.7 kg)  06/08/21 200 lb (90.7 kg)  01/31/21 188 lb (85.3 kg)   SpO2 Readings from Last 3 Encounters:  12/30/22 98%  02/10/21 97%  01/31/21 98%    Physical Exam Vitals and nursing note reviewed.  Constitutional:      General: She is not in acute distress.    Appearance: Normal appearance. She is obese. She is ill-appearing and diaphoretic. She is not toxic-appearing.  HENT:     Head: Normocephalic and atraumatic.     Right Ear: Tympanic membrane, ear canal and external ear normal.     Left Ear: Tympanic membrane, ear canal and external ear normal.     Nose: Congestion present.     Mouth/Throat:     Mouth: Mucous membranes are moist.     Pharynx: Oropharynx is clear. No oropharyngeal exudate or posterior oropharyngeal erythema.  Eyes:     Extraocular Movements: Extraocular movements intact.     Conjunctiva/sclera: Conjunctivae normal.     Pupils: Pupils are equal, round, and reactive to light.  Cardiovascular:      Rate and Rhythm: Normal rate and regular rhythm.     Pulses: Normal pulses.     Heart sounds: Normal heart sounds. No murmur heard.    No friction rub. No gallop.  Pulmonary:     Effort: Pulmonary effort is normal. No respiratory distress.     Breath sounds: Normal breath sounds. No stridor. No wheezing, rhonchi or rales.  Chest:     Chest wall: No tenderness.  Musculoskeletal:        General: No swelling, tenderness, deformity or signs of injury. Normal range of motion.     Cervical back: Normal range of motion and neck supple. No tenderness.     Right lower leg: No edema.     Left lower leg: No edema.  Skin:    General: Skin is warm.     Capillary Refill: Capillary refill takes less than 2 seconds.     Coloration: Skin is not jaundiced or pale.     Findings: No bruising, erythema, lesion or rash.  Neurological:     General: No focal deficit present.     Mental Status: She is alert and oriented to person, place, and time. Mental status is at baseline.     Cranial Nerves: No cranial nerve deficit.     Sensory: No sensory deficit.  Motor: No weakness.     Coordination: Coordination normal.  Psychiatric:        Mood and Affect: Mood normal.        Behavior: Behavior normal.        Thought Content: Thought content normal.        Judgment: Judgment normal.     Results for orders placed or performed in visit on 12/30/22  POC COVID-19  Result Value Ref Range   SARS Coronavirus 2 Ag Positive (A) Negative  POCT Influenza A/B  Result Value Ref Range   Influenza A, POC Negative Negative   Influenza B, POC Negative Negative        Assessment & Plan:   Problem List Items Addressed This Visit       Cardiovascular and Mediastinum   Primary hypertension    Chronic, not treated Encouraged to focus on lifestyle changes following recovery from acute viral infection Goal <140/<90 without hx of CVA or DM         Other   COVID    Acute, symptoms starting <24 hour  ago Works in a school Declines use of anti-virals Continue OTC support relief Your symptoms and exam findings are most consistent with a viral upper respiratory infection. These usually run their course in 5-7 days. Unfortunately, antibiotics don't work against viruses and just increase your risk of other issues such as diarrhea, yeast infections, and resistant infections.  If you start feeling worse with facial pain, high fever, cough, shortness of breath or start feeling significantly worse, please call us right away to be further evaluated.  Some things that can make you feel better are: - Increased rest - Increasing Fluids - Acetaminophen / ibuprofen as needed for fever/pain.  - Salt water gargling, chloraseptic spray and throat lozenges - OTC pseudoephedrine.  - Mucinex.  - Saline sinus flushes or a neti pot.  - Humidifying the air.       Fever - Primary    Unknown cause, with POC + COVID      Relevant Orders   POC COVID-19 (Completed)   POCT Influenza A/B (Completed)    No orders of the defined types were placed in this encounter.   Return if symptoms worsen or fail to improve.  Gwyneth Sprout, FNP

## 2022-12-30 NOTE — Assessment & Plan Note (Signed)
Chronic, not treated Encouraged to focus on lifestyle changes following recovery from acute viral infection Goal <140/<90 without hx of CVA or DM

## 2022-12-30 NOTE — Assessment & Plan Note (Signed)
Acute, symptoms starting <24 hour ago Works in a school Declines use of anti-virals Continue OTC support relief Your symptoms and exam findings are most consistent with a viral upper respiratory infection. These usually run their course in 5-7 days. Unfortunately, antibiotics don't work against viruses and just increase your risk of other issues such as diarrhea, yeast infections, and resistant infections.  If you start feeling worse with facial pain, high fever, cough, shortness of breath or start feeling significantly worse, please call us right away to be further evaluated.  Some things that can make you feel better are: - Increased rest - Increasing Fluids - Acetaminophen / ibuprofen as needed for fever/pain.  - Salt water gargling, chloraseptic spray and throat lozenges - OTC pseudoephedrine.  - Mucinex.  - Saline sinus flushes or a neti pot.  - Humidifying the air.

## 2024-10-03 DIAGNOSIS — Z419 Encounter for procedure for purposes other than remedying health state, unspecified: Secondary | ICD-10-CM | POA: Diagnosis not present
